# Patient Record
Sex: Male | Born: 1960 | Race: Black or African American | Hispanic: No | Marital: Single | Smoking: Current every day smoker
Health system: Southern US, Community
[De-identification: ages and names within clinical notes are randomized; demographics above are authoritative.]

## PROBLEM LIST (undated history)

## (undated) DIAGNOSIS — K219 Gastro-esophageal reflux disease without esophagitis: Secondary | ICD-10-CM

## (undated) DIAGNOSIS — E785 Hyperlipidemia, unspecified: Secondary | ICD-10-CM

## (undated) HISTORY — DX: Gastro-esophageal reflux disease without esophagitis: K21.9

---

## 2003-02-13 ENCOUNTER — Emergency Department (HOSPITAL_COMMUNITY): Admission: EM | Admit: 2003-02-13 | Discharge: 2003-02-13 | Payer: Self-pay | Admitting: Emergency Medicine

## 2003-03-25 ENCOUNTER — Emergency Department (HOSPITAL_COMMUNITY): Admission: EM | Admit: 2003-03-25 | Discharge: 2003-03-25 | Payer: Self-pay | Admitting: Emergency Medicine

## 2003-12-06 ENCOUNTER — Ambulatory Visit (HOSPITAL_COMMUNITY): Admission: RE | Admit: 2003-12-06 | Discharge: 2003-12-06 | Payer: Self-pay | Admitting: Pulmonary Disease

## 2004-09-30 ENCOUNTER — Emergency Department (HOSPITAL_COMMUNITY): Admission: EM | Admit: 2004-09-30 | Discharge: 2004-09-30 | Payer: Self-pay | Admitting: Family Medicine

## 2005-06-05 ENCOUNTER — Emergency Department (HOSPITAL_COMMUNITY): Admission: AD | Admit: 2005-06-05 | Discharge: 2005-06-05 | Payer: Self-pay | Admitting: Family Medicine

## 2005-08-11 ENCOUNTER — Emergency Department (HOSPITAL_COMMUNITY): Admission: EM | Admit: 2005-08-11 | Discharge: 2005-08-11 | Payer: Self-pay | Admitting: Family Medicine

## 2005-08-12 ENCOUNTER — Ambulatory Visit (HOSPITAL_COMMUNITY): Admission: RE | Admit: 2005-08-12 | Discharge: 2005-08-12 | Payer: Self-pay | Admitting: Family Medicine

## 2005-08-12 ENCOUNTER — Emergency Department (HOSPITAL_COMMUNITY): Admission: EM | Admit: 2005-08-12 | Discharge: 2005-08-12 | Payer: Self-pay | Admitting: Family Medicine

## 2007-02-20 ENCOUNTER — Emergency Department (HOSPITAL_COMMUNITY): Admission: EM | Admit: 2007-02-20 | Discharge: 2007-02-20 | Payer: Self-pay | Admitting: Emergency Medicine

## 2008-04-04 ENCOUNTER — Ambulatory Visit (HOSPITAL_BASED_OUTPATIENT_CLINIC_OR_DEPARTMENT_OTHER): Admission: RE | Admit: 2008-04-04 | Discharge: 2008-04-05 | Payer: Self-pay | Admitting: Specialist

## 2009-08-12 ENCOUNTER — Encounter: Payer: Self-pay | Admitting: Internal Medicine

## 2009-12-16 ENCOUNTER — Encounter: Payer: Self-pay | Admitting: Internal Medicine

## 2009-12-24 ENCOUNTER — Encounter: Payer: Self-pay | Admitting: Internal Medicine

## 2009-12-30 ENCOUNTER — Ambulatory Visit (HOSPITAL_COMMUNITY): Admission: RE | Admit: 2009-12-30 | Discharge: 2009-12-30 | Payer: Self-pay | Admitting: Pulmonary Disease

## 2010-01-09 ENCOUNTER — Ambulatory Visit (HOSPITAL_COMMUNITY): Admission: RE | Admit: 2010-01-09 | Discharge: 2010-01-09 | Payer: Self-pay | Admitting: Pulmonary Disease

## 2010-01-14 ENCOUNTER — Encounter: Payer: Self-pay | Admitting: Internal Medicine

## 2010-01-20 ENCOUNTER — Ambulatory Visit: Payer: Self-pay | Admitting: Internal Medicine

## 2010-01-20 DIAGNOSIS — R0602 Shortness of breath: Secondary | ICD-10-CM | POA: Insufficient documentation

## 2010-01-20 DIAGNOSIS — J189 Pneumonia, unspecified organism: Secondary | ICD-10-CM | POA: Insufficient documentation

## 2010-01-20 DIAGNOSIS — K219 Gastro-esophageal reflux disease without esophagitis: Secondary | ICD-10-CM | POA: Insufficient documentation

## 2010-01-20 DIAGNOSIS — I10 Essential (primary) hypertension: Secondary | ICD-10-CM | POA: Insufficient documentation

## 2010-01-20 DIAGNOSIS — E7800 Pure hypercholesterolemia, unspecified: Secondary | ICD-10-CM | POA: Insufficient documentation

## 2010-01-20 DIAGNOSIS — R5383 Other fatigue: Secondary | ICD-10-CM

## 2010-01-20 DIAGNOSIS — F17200 Nicotine dependence, unspecified, uncomplicated: Secondary | ICD-10-CM | POA: Insufficient documentation

## 2010-01-20 DIAGNOSIS — IMO0001 Reserved for inherently not codable concepts without codable children: Secondary | ICD-10-CM | POA: Insufficient documentation

## 2010-01-20 DIAGNOSIS — R5381 Other malaise: Secondary | ICD-10-CM | POA: Insufficient documentation

## 2010-01-29 ENCOUNTER — Encounter: Payer: Self-pay | Admitting: Internal Medicine

## 2010-01-29 ENCOUNTER — Ambulatory Visit (HOSPITAL_COMMUNITY): Admission: RE | Admit: 2010-01-29 | Discharge: 2010-01-29 | Payer: Self-pay | Admitting: Internal Medicine

## 2010-02-10 ENCOUNTER — Ambulatory Visit: Payer: Self-pay | Admitting: Internal Medicine

## 2010-02-10 DIAGNOSIS — J984 Other disorders of lung: Secondary | ICD-10-CM | POA: Insufficient documentation

## 2010-02-14 ENCOUNTER — Emergency Department (HOSPITAL_COMMUNITY): Admission: EM | Admit: 2010-02-14 | Discharge: 2010-02-14 | Payer: Self-pay | Admitting: Emergency Medicine

## 2010-02-16 ENCOUNTER — Ambulatory Visit: Payer: Self-pay | Admitting: Internal Medicine

## 2010-04-08 ENCOUNTER — Ambulatory Visit (HOSPITAL_COMMUNITY): Admission: RE | Admit: 2010-04-08 | Discharge: 2010-04-08 | Payer: Self-pay | Admitting: Pulmonary Disease

## 2010-04-16 ENCOUNTER — Ambulatory Visit (HOSPITAL_COMMUNITY): Admission: RE | Admit: 2010-04-16 | Discharge: 2010-04-16 | Payer: Self-pay | Admitting: Pulmonary Disease

## 2010-08-02 ENCOUNTER — Encounter: Payer: Self-pay | Admitting: Pulmonary Disease

## 2010-08-11 NOTE — Assessment & Plan Note (Signed)
Summary: rov/pft/cxr/apc   Visit Type:  Follow-up Copy to:  Dr Corine Shelter Primary Provider/Referring Provider:  Dr Corine Shelter  CC:  2 wk follow up with CXR and to discuss PFT results.  Pt states approx 1 wk after last OV SOB resolved.  However states he "caught a cold" x1 week ago-SOB returned.  States he coughs at time-cough is prod with white mucus.  Denies wheezing and chest tightness. Currently smoking 1/2ppd.  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 PReviously well and very functional wihtout symptoms other than occassional cough with cigarrettes. States that on 12/09/2009 had sudden onset Rt infraclavicular chest pain while on job in [place]. After that, felt he was "going down hill"; feeling dizzy, not eating well, no energy and chills. Was trying to working during this time. States that with this he got dehydrated and could not work. States he was in and out of consciousness. Finally admitted in hospital to ICU  in [place], [place] 12/16/2009 - 12/24/2009 for "pneumonia" per history. Returned back to [employer] after discharge. He then followed with Dr. Petra Kuba here in town on 12/30/2009. CXR showed RUL atlectasis/infiltrte c/w pneumonia. Folllowup CXR 01/09/2010 showed persistence of infiltrate and therefore referred here.    Current symptoms are dyspnea and tiredeness - all new since hospitalization. Dyspnea is unimproved since discharge from hospital. Dyspnea brought on by exertion like "Walking good distances". Symptoms relieved by "resting". Denies current chest pain and wheezing but admits to some associated dry cough.  Also has left 4th and 5th finger numbness with toe numbness - all new since discharge.   REcl 1) Repeat cxr on 02/10/2010 - 8 weeks since pna 2) get outside record 3) quit smoking 4) get ful PFTOV 02/10/2010: follows today for tobacco abuse, RUL PNA in June 2011, and dyspnea after PFTs. Since last visit is doing well overall.  Dyspnea improved but a week ago he picked up a cold and felt dyspneic and wheezy again but now almost back to baseline. STill smoking 1/2 packper day. No definite quit plans. He is not ready today for chantix.  CXR done today shows improvement but stil persistence of RUL infilrate compated to June 2011. Review of records ffrom [place] shows that he was indeed admitted for RUL wedge shaped pneumonia and wasin ICU for the same. No films from [place] present but it appears RUL infiltrate  was improving there.   Preventive Screening-Counseling & Management  Alcohol-Tobacco     Smoking Status: current     Smoking Cessation Counseling: yes     Smoke Cessation Stage: contemplative     Packs/Day: 1/2 PPD     Year Started: 1980     Tobacco Counseling: to quit use of tobacco products  Current Medications (verified): 1)  Crestor 20 Mg Tabs (Rosuvastatin Calcium) .... Take 1 Tablet By Mouth Once A Day 2)  Trilipix 135 Mg Cpdr (Choline Fenofibrate) .... Take 1 Tablet By Mouth Once A Day 3)  Prevacid 15 Mg Cpdr (Lansoprazole) .... Take 1 Tablet By Mouth Once A Day  Allergies (verified): No Known Drug Allergies  Past History:  Family History: Last updated: 01/20/2010 PT WAS ADOPTED--HIS ADOPTIVE FATHER HAD STOMACH CANCER  Social History: Last updated: 01/20/2010 CURRENT SMOKER--1/2 PPD STARTED IN 1980 SINGLE NO CHILDREN LIVES WITH HIS SISTER TRUCK DRIVER FORMER DRUG ABUSE--QUIT 04-28-1999  Risk Factors: Smoking Status: current (02/10/2010) Packs/Day: 1/2 PPD (02/10/2010)  Past Medical History: GERD Hyperlipidemia Gold Stage 1 COPD > PFT  01/29/2010 - FVC 5L/96%, Fev1 3.45L/82%, Ratio 69, TLC 9L/113%, RV 3.45l/113, dlco 26/63% Tobacco Abuse  RUL PNA June 2011 in [place] Denies CAD, stroke, edema, Arthritis  Past Surgical History: Reviewed history from 01/20/2010 and no changes required. EAR SURGERY BUNION REMOVAL ROTATOR CUFF SURGERY  Past Pulmonary History:  Pulmonary  History: RESULTS from [facility]  > 12/19/2009 - hiv NEGATIVE  Family History: Reviewed history from 01/20/2010 and no changes required. PT WAS ADOPTED--HIS ADOPTIVE FATHER HAD STOMACH CANCER  Social History: Reviewed history from 01/20/2010 and no changes required. CURRENT SMOKER--1/2 PPD STARTED IN 1980 SINGLE NO CHILDREN LIVES WITH HIS SISTER TRUCK DRIVER FORMER DRUG ABUSE--QUIT 04-28-1999  Review of Systems       The patient complains of shortness of breath with activity, shortness of breath at rest, productive cough, acid heartburn, headaches, nasal congestion/difficulty breathing through nose, and sneezing.  The patient denies non-productive cough, coughing up blood, chest pain, irregular heartbeats, indigestion, loss of appetite, weight change, abdominal pain, difficulty swallowing, sore throat, tooth/dental problems, itching, ear ache, anxiety, depression, hand/feet swelling, joint stiffness or pain, rash, change in color of mucus, and fever.    Vital Signs:  Patient profile:   50 year old male Height:      75 inches Weight:      221 pounds BMI:     27.72 O2 Sat:      94 % Temp:     96.5 degrees F Pulse rate:   82 / minute BP sitting:   132 / 90  (right arm) Cuff size:   regular  Vitals Entered By: Gweneth Dimitri RN (February 10, 2010 3:45 PM) CC: 2 wk follow up with CXR and to discuss PFT results.  Pt states approx 1 wk after last OV SOB resolved.  However states he "caught a cold" x1 week ago-SOB returned.  States he coughs at time-cough is prod with white mucus.  Denies wheezing and chest tightness. Currently smoking 1/2ppd Comments Medications reviewed with patient Daytime contact number verified with patient. Gweneth Dimitri RN  February 10, 2010 3:50 PM    Physical Exam  General:  well developed, well nourished, in no acute distress Head:  normocephalic and atraumatic Eyes:  PERRLA/EOM intact; conjunctiva and sclera clear Ears:  TMs intact and clear  with normal canals Nose:  no deformity, discharge, inflammation, or lesions Mouth:  no deformity or lesions Neck:  no masses, thyromegaly, or abnormal cervical nodes Chest Wall:  no deformities noted Lungs:  clear bilaterally to auscultation and percussion Heart:  regular rate and rhythm, S1, S2 without murmurs, rubs, gallops, or clicks Abdomen:  bowel sounds positive; abdomen soft and non-tender without masses, or organomegaly Msk:  no deformity or scoliosis noted with normal posture Pulses:  pulses normal Extremities:  no clubbing, cyanosis, edema, or deformity noted Neurologic:  CN II-XII grossly intact with normal reflexes, coordination, muscle strength and tone Skin:  intact without lesions or rashes Cervical Nodes:  no significant adenopathy Axillary Nodes:  no significant adenopathy Psych:  alert and cooperative; normal mood and affect; normal attention span and concentration   CXR  Procedure date:  02/10/2010  Findings:      improving but persistent infiltrate RUL  Comments:      independently rviewed  Impression & Recommendations:  Problem # 1:  PNEUMONIA, ORGAN UNSPECIFIED (ICD-486) Assessment Improved  Orders: Radiology Referral (Radiology) T-2 View CXR (71020TC) Est. Patient Level IV (67124) He had severe RUL CAP and sepsis with  alterened mental status in early June 2011.  There is  RUL infiltrate as of 01/09/2010. I will see him in 2 weeks with ful cxr which shuld put him at 2 months since onset of symptoms. CXR 02/10/2010 shows persistent RUL infitlrate even though he is 2 months out of Rx. Due to smoking hx best to get CT scan Orders: Pulmonary Referral (Pulmonary)  Problem # 2:  TOBACCO ABUSE (ICD-305.1) Assessment: Unchanged still smoking half pack per day. Not ready to quit.No quit date set. HEsitatnt today to start chantix buecause of unreadiness to quit. I have told him to motivate himself to quti and next visit I want a quit date  3 min  counselling Orders: Radiology Referral (Radiology) Est. Patient Level IV (16109) Tobacco use cessation intermediate 3-10 minutes (60454)  Problem # 3:  SHORTNESS OF BREATH (SOB) (ICD-786.05) Assessment: Improved PFT shows gold stage 1 copd. Will start spiriva and see how he does. Samples given Orders: T-2 View CXR (71020TC) Est. Patient Level IV (09811)  Medications Added to Medication List This Visit: 1)  Prevacid 15 Mg Cpdr (Lansoprazole) .... Take 1 tablet by mouth once a day  Patient Instructions: 1)  please have cT scan chest 2)  my nurse will give you 2 samples of spiriva 3)  take 1 puff daily - learn technique from my nurse 4)  I will call you with CT scan results to decide next step

## 2010-08-11 NOTE — Letter (Signed)
Summary: [facility]  [facility]   Imported By: Lester Flemington 02/18/2010 10:29:50  _____________________________________________________________________  External Attachment:    Type:   Image     Comment:   External Document

## 2010-08-11 NOTE — Letter (Signed)
Summary: Corine Shelter MD  Corine Shelter MD   Imported By: Sherian Rein 01/22/2010 10:47:00  _____________________________________________________________________  External Attachment:    Type:   Image     Comment:   External Document

## 2010-08-11 NOTE — Assessment & Plan Note (Signed)
Summary: pna, rul pulm infiltrates/apc   Visit Type:  Initial Consult Copy to:  Dr Corine Shelter Primary Provider/Referring Provider:  Dr Corine Shelter  CC:  CONSULT FOR PNA--SHORTNESS OF BREATH.  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 PReviously well and very functional wihtout symptoms other than occassional cough with cigarrettes. States that on 12/09/2009 had sudden onset Rt infraclavicular chest pain while on job in [place]. After that, felt he was "going down hill"; feeling dizzy, not eating well, no energy and chills. Was trying to working during this time. States that with this he got dehydrated and could not work. States he was in and out of consciousness. Finally admitted in hospital to ICU  in [place], [place] 12/16/2009 - 12/24/2009 for "pneumonia" per history. Returned back to [employer] after discharge. He then followed with Dr. Petra Kuba here in town on 12/30/2009. CXR showed RUL atlectasis/infiltrte c/w pneumonia. Folllowup CXR 01/09/2010 showed persistence of infiltrate and therefore referred here.    Current symptoms are dyspnea and tiredeness - all new since hospitalization. Dyspnea is unimproved since discharge from hospital. Dyspnea brought on by exertion like "Walking good distances". Symptoms relieved by "resting". Denies current chest pain and wheezing but admits to some associated dry cough.  Also has left 4th and 5th finger numbness with toe numbness - all new since discharge.   Preventive Screening-Counseling & Management  Alcohol-Tobacco     Smoking Status: current     Smoking Cessation Counseling: yes     Smoke Cessation Stage: contemplative     Packs/Day: 1/2 PPD     Year Started: 1980  Comments: has tried chantix in year 2010 for 1 week. No side effects. He felt it was effective but was expensive. He admitted that cost of smoking and chantix is the same      Drug Use:  former and QUIT 2000.    Allergies  (verified): No Known Drug Allergies  Past History:  Family History: Last updated: 01/20/2010 PT WAS ADOPTED--HIS ADOPTIVE FATHER HAD STOMACH CANCER  Social History: Last updated: 01/20/2010 CURRENT SMOKER--1/2 PPD STARTED IN 1980 SINGLE NO CHILDREN LIVES WITH HIS SISTER TRUCK DRIVER FORMER DRUG ABUSE--QUIT 04-28-1999  Risk Factors: Smoking Status: current (01/20/2010) Packs/Day: 1/2 PPD (01/20/2010)  Past Medical History: GERD Hyperlipidemia Denies CAD, stroke, edema, Arthritis  Past Surgical History: EAR SURGERY BUNION REMOVAL ROTATOR CUFF SURGERY  Family History: PT WAS ADOPTED--HIS ADOPTIVE FATHER HAD STOMACH CANCER  Social History: CURRENT SMOKER--1/2 PPD STARTED IN 1980 SINGLE NO CHILDREN LIVES WITH HIS SISTER TRUCK DRIVER FORMER DRUG ABUSE--QUIT 10-17-2000Smoking Status:  current Packs/Day:  1/2 PPD Drug Use:  former, QUIT 2000  Review of Systems       The patient complains of shortness of breath with activity, acid heartburn, and hand/feet swelling.  The patient denies shortness of breath at rest, productive cough, non-productive cough, coughing up blood, chest pain, irregular heartbeats, indigestion, loss of appetite, weight change, abdominal pain, difficulty swallowing, sore throat, tooth/dental problems, headaches, nasal congestion/difficulty breathing through nose, sneezing, itching, ear ache, anxiety, depression, joint stiffness or pain, rash, change in color of mucus, and fever.         HAND AND FEET NUMBNESS - since discharge  Vital Signs:  Patient profile:   50 year old male Height:      75 inches Weight:      217.50 pounds BMI:     27.28 O2 Sat:      96 % on Room air Temp:  98.4 degrees F oral Pulse rate:   97 / minute BP sitting:   134 / 78  (right arm) Cuff size:   regular  Vitals Entered By: Randell Loop CMA (January 20, 2010 10:27 AM)  O2 Sat at Rest %:  96 O2 Flow:  Room air CC: CONSULT FOR PNA--SHORTNESS OF  BREATH   Physical Exam  General:  well developed, well nourished, in no acute distress Head:  normocephalic and atraumatic Eyes:  PERRLA/EOM intact; conjunctiva and sclera clear Ears:  TMs intact and clear with normal canals Nose:  no deformity, discharge, inflammation, or lesions Mouth:  no deformity or lesions Neck:  no masses, thyromegaly, or abnormal cervical nodes Chest Wall:  no deformities noted Lungs:  clear bilaterally to auscultation and percussion Heart:  regular rate and rhythm, S1, S2 without murmurs, rubs, gallops, or clicks Abdomen:  bowel sounds positive; abdomen soft and non-tender without masses, or organomegaly Msk:  no deformity or scoliosis noted with normal posture Pulses:  pulses normal Extremities:  no clubbing, cyanosis, edema, or deformity noted Neurologic:  CN II-XII grossly intact with normal reflexes, coordination, muscle strength and tone Skin:  intact without lesions or rashes Cervical Nodes:  no significant adenopathy Axillary Nodes:  no significant adenopathy Psych:  alert and cooperative; normal mood and affect; normal attention span and concentration   CXR  Procedure date:  12/30/2009  Findings:      Comparison: 04/04/2008.  No recent chest radiograph or chest CT is   available for comparison.    Findings: Heart size within normal limits; heart and mediastinal   contours are stable.  Trachea is midline.    Patchy faint airspace opacity in the right upper lobe adjacent to   the minor fissure is present. This is compatible with pneumonia.   No dense consolidation or cavitation identified.  This represents a   change compared to chest radiograph of 2009.  3-4 mm calcific   density left midlung zone is unchanged, and most consistent with a   stable calcified granuloma.    Question left nipple shadow (left basilar pulmonary nodule is not   excluded).    Negative for pulmonary edema.    Mild convex right scoliosis of the upper thoracic  spine appears   stable.  No acute bony abnormality is seen.    IMPRESSION:    1.  Right upper lobe airspace disease is compatible with clinical   diagnosis of pneumonia.  There are no recent prior chest   radiographs for comparison.  Follow-up chest radiograph to clearing   is recommended.  Suggest nipple markers on the follow-up chest   radiograph, as there is a probable left nipple shadow seen on   today's exam.   2.  Tiny stable calcified granuloma left lung.    Read By:  Oliver Hum,  M.D.   Released By:  Oliver Hum,  M.D.   Comments:      reviewed  CXR  Procedure date:  01/09/2010  Findings:       Clinical Data: Pneumonia follow up.  Coughing and shortness of   breath.  History of tobacco smoking.    CHEST - 2 VIEW    Comparison: 04/04/2008.  12/30/2009.    Findings: Abnormal opacity persists in the basilar portion of the   right upper lobe just superior to the minor fissure on the PA view   and overlying the descending aorta proximally on the lateral image.   No clearing has occurred  since prior study.    There is overall hyperinflation configuration.  Small calcified   granuloma is unchanged in the lower left midlung zone.  The vague   area of nodularity in the left base is again seen and may be   related to nipple.  Pulmonary nodule is not excluded.    The cardiac silhouette is normal size and shape. There is slight   scoliosis convexity to the right    IMPRESSION:   Persistent abnormal opacity is seen in the basilar portion of the   right upper lobe.  No clearing has occurred with therapy.     Is the patient still symptomatic.  Further therapy could be given   with follow-up radiograph examination after further therapy.   Suggest consideration of CT of the chest for additional evaluation   of nonclearing abnormal pulmonary opacity.  Vague area of   nodularity in the left base may be related to nipple.  Pulmonary   nodule not excluded.    Read  By:  Crawford Givens,  M.D.   Released By:  Crawford Givens,  M.D.   Comments:      reviewed  MISC. Report  Procedure date:  08/12/2009  Findings:      wc 12K Hb 15.5gm% Plat 252K Creat 1.1  Impression & Recommendations:  Problem # 1:  PNEUMONIA, ORGAN UNSPECIFIED (ICD-486) Assessment New  It sound like he had severe CAP and sepsis with alterened mental status in early June 2011. This all per his hx. REcords unavailable becuase he was treated in [place]. There is  RUL infiltrate as of 01/09/2010. I will see him in 2 weeks with ful cxr which shuld put him at 2 months since onset of symptoms. If infiltrate unresolved, will do CT scan  Orders: Pulmonary Referral (Pulmonary) Consultation Level V (15176) Tobacco use cessation intensive >10 minutes (16073)  Problem # 2:  FATIGUE (ICD-780.79) Assessment: New He has significant fatigue post hospitalization. THis could just be sequelae from sepsis. However, he is on statins. Apparently he has had blood work last week. I will get these from Dr. Petra Kuba. If needed will check CK  Problem # 3:  TOBACCO ABUSE (ICD-305.1) Assessment: New WE discussed for 11 minutes smoking habits and quitting. He is in contemplative phase. HE did tolerate chantix for a week in 2010. He found it expensive but the same as smoking. I pointed this out. He is wiling to try chantix next week. He does not have guns, and is not suicidal or homcidoal or has psych history. Does have CAD risk factors but age is on his side. We will start chantix next visit Orders: Pulmonary Referral (Pulmonary) Consultation Level V (71062) Tobacco use cessation intensive >10 minutes (69485)  Problem # 4:  SHORTNESS OF BREATH (SOB) (ICD-786.05) Assessment: New This is new since hospitalization for pneumonia in Early June 2011. COuld reflect deconditioning of COPD coming to light. Will get PFts in 2 weeks and decide mgmt Orders: Pulmonary Referral (Pulmonary) TLB-CBC Platelet -  w/Differential (85025-CBCD) Consultation Level V (46270)  Medications Added to Medication List This Visit: 1)  Crestor 20 Mg Tabs (Rosuvastatin calcium) .... Take 1 tablet by mouth once a day 2)  Trilipix 135 Mg Cpdr (Choline fenofibrate) .... Take 1 tablet by mouth once a day  Other Orders: TLB-Hepatic/Liver Function Pnl (80076-HEPATIC) TLB-CK-MB (Creatine Kinase MB) (82553-CKMB) TLB-CK Total Only(Creatine Kinase/CPK) (82550-CK)  Patient Instructions: 1)  please sign release to get all your records from [place] 2)  Get blood  test results from Dr. Petra Kuba house 3)  Please get CXR and full breathing test in 2 weeks 4)  return to see me after above 5)  Please work on quitting smoking by yourself 6)  Next visit we can start chantix 7)  Discuss numbness with your doctor    Appended Document: walk done at ov today    Clinical Lists Changes      walk done with pt at ov today--  initial check---98% ra  HR  95  1 st lap--100% ra  HR 101 2nd lap--100%ra  HR 106 3rd lap--100%ra  HR 106  rest--100%ra  Hr 101  test finished but pt c/o of a little dizziness. Randell Loop [facility]  January 20, 2010 11:52 AM

## 2010-10-13 ENCOUNTER — Other Ambulatory Visit (HOSPITAL_COMMUNITY): Payer: Self-pay | Admitting: Pulmonary Disease

## 2010-10-13 ENCOUNTER — Ambulatory Visit (HOSPITAL_COMMUNITY)
Admission: RE | Admit: 2010-10-13 | Discharge: 2010-10-13 | Disposition: A | Discharge status: Home / Self Care | Payer: 59 | Source: Ambulatory Visit | Attending: Pulmonary Disease | Admitting: Pulmonary Disease

## 2010-10-13 DIAGNOSIS — R079 Chest pain, unspecified: Secondary | ICD-10-CM | POA: Insufficient documentation

## 2010-10-13 DIAGNOSIS — R52 Pain, unspecified: Secondary | ICD-10-CM

## 2010-11-24 NOTE — Op Note (Signed)
NAMEMarland King  Melvin, King NO.:  [id number]   MEDICAL RECORD NO.:  [id number]          PATIENT TYPE:  AMB   LOCATION:  [facility]                         FACILITY:  [facility]   PHYSICIAN:  Jene Every, M.D.    DATE OF BIRTH:  1960-07-26   DATE OF PROCEDURE:  04/04/2008  DATE OF DISCHARGE:                               OPERATIVE REPORT   PREOPERATIVE DIAGNOSIS:  Rotator cuff tear and impingement syndrome,  right shoulder.   POSTOPERATIVE DIAGNOSIS:  Rotator cuff tear and impingement syndrome,  right shoulder, labral tear.   PROCEDURE PERFORMED:  1. Right shoulder arthroscopy.  2. Debridement of anterior glenoid labral tear.  3. Subacromial decompression, bursectomy, acromioplasty.  4. Mini open rotator cuff repair.   ANESTHESIA:  General.   ASSISTANT:  Roma Schanz, P.A.   BRIEF HISTORY AND INDICATIONS:  The patient is a 47-year male with  refractory pain in the shoulder.  He had a rotator cuff tear that  appeared to be a partial tear by his MRI.  He had not improved with  conservative treatment and is indicated for evaluation, subacromial  decompression, debridement, possible open rotator cuff repair.  Risks  and benefits discussed including bleeding, infection, no change in  symptoms, worsening symptoms, need for repeat debridement, anesthetic  complications etc.   TECHNIQUE:  Placed in supine beach-chair position after inducted with  adequate general anesthesia and 2 grams Kefzol the right shoulder upper  extremity prepped and draped in the usual sterile fashion.  A surgical  marker was utilized to delineate the acromion, AC joint and the  coracoid.  Good range of motion was noted. A standard posterolateral  portal was utilized.  Incision through the skin only.  Arthroscopic  cannula was directed towards the glenohumeral joint penetrating the  capsule atraumatically with the arm in 70/30 position with 10-15 pounds  traction applied.  Noted an anterior tear of  the glenoid labrum.  It was  not detached.  There was an undersurface tear of the rotator cuff noted  as well, position consistent with that seen on MRI.   Next, an anterior portal was utilized.  Incision made through the skin  only between the coracoid and the anterolateral aspect of the acromion  and directed the cannula into the glenohumeral joint just beneath the  biceps under direct visualization, penetrating the capsule  atraumatically.  Collene Mares was introduced, utilized to debride the anterior  labrum.  Subscap was unremarkable.  Minor degenerative changes of  glenohumeral joint.  Under surface tear was noted significantly of the  rotator cuff though very small.  I placed an 18 gauge spinal needle from  external to internal through this area of pathology and threaded a  Ethibond suture through that, removed the knee.  This marked the area  for the repair.   Next we redirected the camera in the subacromial space.  A lateral  portal was utilized.  Again incision through the skin only.  Hypertrophic bursa was noted and bursitis inflammatory arthropathy.  We  performed a bursectomy and we morselized the CA ligament.  Anterolateral  spur was removed.  The threaded portion of the Ethibond suture was  identified in the supraspinatus the more posterior portion of that.  After the subacromial decompression, I made a small incision  anterolaterally through the skin only. A small incision through the  raphe, a self-retaining retractor.  We identified the insertion of the  PDS suture.  A very small tear was noted.  I incised it about 0.5 cm in  length debrided it to good bleeding tissue and repaired it side-to-side  with 0 Vicryl in simple sutures.  Again side-to-side there was no  significant tear noted here.  We did not need a suture anchor.  Again we  inspected beneath the subacromial space.  It was improved following the  release the CA ligament.  I  remove a small spur over the  anterolateral  aspect of the acromion with a Theatre manager.  I digitally palpated the  subacromial space and found it to be satisfactory.  Wound was copiously  irrigated.  I then fully inspected the rotator cuff with no evidence of  further tearing.  I repaired the raphe with #1 Vicryl interrupted figure-  of-eight sutures.  Subcutaneous tissue reapproximated with 2-0 Vicryl  simple sutures.  Skin was reapproximated with 4-0 subcuticular Prolene.  Wound reinforced with Steri-Strips.  Sterile dressing applied.  Placed  supine on the hospital bed, extubated without difficulty and transported  to recovery room in satisfactory condition.   The patient tolerated the procedure well with no complications.  He was  placed in a sling.      Jene Every, M.D.  Electronically Signed     JB/MEDQ  D:  04/04/2008  T:  04/06/2008  Job:  [id number]

## 2011-05-23 ENCOUNTER — Emergency Department (INDEPENDENT_AMBULATORY_CARE_PROVIDER_SITE_OTHER): Payer: 59

## 2011-05-23 ENCOUNTER — Encounter: Payer: Self-pay | Admitting: *Deleted

## 2011-05-23 ENCOUNTER — Emergency Department (INDEPENDENT_AMBULATORY_CARE_PROVIDER_SITE_OTHER)
Admission: EM | Admit: 2011-05-23 | Discharge: 2011-05-23 | Disposition: A | Discharge status: Home / Self Care | Payer: 59 | Source: Home / Self Care | Attending: Emergency Medicine | Admitting: Emergency Medicine

## 2011-05-23 DIAGNOSIS — S62309A Unspecified fracture of unspecified metacarpal bone, initial encounter for closed fracture: Secondary | ICD-10-CM

## 2011-05-23 HISTORY — DX: Hyperlipidemia, unspecified: E78.5

## 2011-05-23 MED ORDER — HYDROCODONE-IBUPROFEN 7.5-200 MG PO TABS: 1.0000 | ORAL_TABLET | Freq: Three times a day (TID) | ORAL | Status: AC | PRN
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
## 2011-05-23 NOTE — ED Notes (Signed)
Awaiting consult w/ Dr. Merlyn Lot.  Pt notified.

## 2011-05-23 NOTE — Progress Notes (Signed)
Orthopedic Tech Progress Note Patient Details:  Melvin King [birth year] [id number]  Type of Splint: Other (comment) (ulnar gutter) Splint Location: right hand Splint Interventions: Application    Nikki Dom 05/23/2011, 4:20 PM

## 2011-05-23 NOTE — ED Notes (Signed)
Ortho tech present applying splint. 
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
## 2012-08-15 ENCOUNTER — Encounter (HOSPITAL_COMMUNITY): Payer: Self-pay | Admitting: *Deleted

## 2012-08-15 ENCOUNTER — Emergency Department (HOSPITAL_COMMUNITY)
Admission: EM | Admit: 2012-08-15 | Discharge: 2012-08-15 | Disposition: A | Discharge status: Home / Self Care | Payer: 59 | Attending: Emergency Medicine | Admitting: Emergency Medicine

## 2012-08-15 DIAGNOSIS — R509 Fever, unspecified: Secondary | ICD-10-CM | POA: Insufficient documentation

## 2012-08-15 DIAGNOSIS — B9789 Other viral agents as the cause of diseases classified elsewhere: Secondary | ICD-10-CM | POA: Insufficient documentation

## 2012-08-15 DIAGNOSIS — E785 Hyperlipidemia, unspecified: Secondary | ICD-10-CM | POA: Insufficient documentation

## 2012-08-15 DIAGNOSIS — F17200 Nicotine dependence, unspecified, uncomplicated: Secondary | ICD-10-CM | POA: Insufficient documentation

## 2012-08-15 DIAGNOSIS — R5381 Other malaise: Secondary | ICD-10-CM | POA: Insufficient documentation

## 2012-08-15 DIAGNOSIS — IMO0001 Reserved for inherently not codable concepts without codable children: Secondary | ICD-10-CM | POA: Insufficient documentation

## 2012-08-15 DIAGNOSIS — R51 Headache: Secondary | ICD-10-CM | POA: Insufficient documentation

## 2012-08-15 DIAGNOSIS — B349 Viral infection, unspecified: Secondary | ICD-10-CM

## 2012-08-15 MED ORDER — GUAIFENESIN-CODEINE 100-10 MG/5ML PO SYRP: 5.0000 mL | ORAL_SOLUTION | Freq: Three times a day (TID) | ORAL | Status: DC | PRN

## 2012-08-15 NOTE — ED Provider Notes (Signed)
History   This chart was scribed for Melvin King, a non-physician practitioner working with Gilda Crease, * by Lewanda Rife, ED Scribe. This patient was seen in room WTR5/WTR5 and the patient's care was started at 3:23 pm.     CSN: [id number]  Arrival date & time 08/15/12  1431   None     Chief Complaint  Patient presents with  . Cough  . Fever    (Consider location/radiation/quality/duration/timing/severity/associated sxs/prior treatment) HPI Melvin King is a 52 y.o. male who presents to the Emergency Department complaining of intermittent productive cough with green sputum onset 3 days. Pt reports constant moderate generalized myalgias, low-grade fever, fatigue, and headaches. Pt denies otalgia, emesis, hemoptysis, diarrhea, and sore throat. Pt denies taking any medications at home to treat symptoms. Pt reports symptoms are aggravated by nothing and relieved by nothing. Pt denies getting flu shot this year.   Past Medical History  Diagnosis Date  . Hyperlipidemia     History reviewed. No pertinent past surgical history.  History reviewed. No pertinent family history.  History  Substance Use Topics  . Smoking status: Current Every Day Smoker -- 1.0 packs/day    Types: Cigarettes  . Smokeless tobacco: Not on file  . Alcohol Use: No      Review of Systems  Constitutional: Positive for fever.  HENT: Negative for sore throat and rhinorrhea.   Respiratory: Positive for cough.   Cardiovascular: Negative.   Gastrointestinal: Negative.  Negative for nausea, vomiting and diarrhea.  Musculoskeletal: Positive for myalgias.  Skin: Negative.   Neurological: Positive for headaches.  Hematological: Negative.   Psychiatric/Behavioral: Negative.   All other systems reviewed and are negative.   A complete 10 system review of systems was obtained and all systems are negative except as noted in the HPI and PMH.    Allergies  Review of patient's  allergies indicates no known allergies.  Home Medications   Current Outpatient Rx  Name  Route  Sig  Dispense  Refill  . DM-PHENYLEPHRINE-ACETAMINOPHEN 10-5-325 MG/15ML PO LIQD   Oral   Take 15 mLs by mouth every 4 (four) hours as needed. Cold/flu symptoms.         . GUAIFENESIN-CODEINE 100-10 MG/5ML PO SYRP   Oral   Take 5 mLs by mouth 3 (three) times daily as needed for cough.   120 mL   0     BP 114/66  Pulse 111  Temp 99.8 F (37.7 C) (Oral)  Resp 20  SpO2 95%  Physical Exam  Nursing note and vitals reviewed. Constitutional: He is oriented to person, place, and time. He appears well-developed and well-nourished. No distress.       States he feels "tired"  HENT:  Head: Normocephalic and atraumatic.  Right Ear: Tympanic membrane and external ear normal.  Left Ear: Tympanic membrane and external ear normal.  Nose: Nose normal.  Mouth/Throat: Uvula is midline. Mucous membranes are dry (mildly ). No oropharyngeal exudate.  Eyes: Conjunctivae normal and EOM are normal.  Neck: Neck supple. No tracheal deviation present.  Cardiovascular: Normal rate and normal heart sounds.   Pulmonary/Chest: Effort normal and breath sounds normal. No respiratory distress. He has no wheezes. He has no rales.  Abdominal: Soft.  Musculoskeletal: Normal range of motion. He exhibits no edema.  Neurological: He is alert and oriented to person, place, and time.  Skin: Skin is warm and dry.  Psychiatric: He has a normal mood and affect. His behavior is normal.  ED Course  Procedures (including critical care time)  Medications  DM-Phenylephrine-Acetaminophen (TYLENOL COLD MULTI-SYMPTOM) 10-5-325 MG/15ML LIQD (not administered)  guaiFENesin-codeine (ROBITUSSIN AC) 100-10 MG/5ML syrup (not administered)    Labs Reviewed - No data to display No results found.   1. Viral syndrome       MDM  Exam benign. Pt encouraged to drink plenty of fluids and given strict return to ED  precautions such as high fevers, confusion, neck pain or stiffness, headache that wont resolved with Tylenol/IIbuprofen.  Pt has been advised of the symptoms that warrant their return to the ED. Patient has voiced understanding and has agreed to follow-up with the PCP or specialist.  I personally performed the services described in this documentation, which was scribed in my presence. The recorded information has been reviewed and is accurate.      Dorthula Matas, PA 08/15/12 1542

## 2012-08-15 NOTE — ED Notes (Signed)
Pt presents to ed with c/o flu like symptoms x 3 days. Pt reports headache, fever, body aches.

## 2012-08-15 NOTE — ED Provider Notes (Signed)
Medical screening examination/treatment/procedure(s) were performed by non-physician practitioner and as supervising physician I was immediately available for consultation/collaboration.   Gilda Crease, MD 08/15/12 [phone]

## 2013-01-11 ENCOUNTER — Other Ambulatory Visit (HOSPITAL_COMMUNITY): Payer: Self-pay | Admitting: Pulmonary Disease

## 2013-01-11 DIAGNOSIS — R3129 Other microscopic hematuria: Secondary | ICD-10-CM

## 2013-01-16 ENCOUNTER — Ambulatory Visit (HOSPITAL_COMMUNITY)
Admission: RE | Admit: 2013-01-16 | Discharge: 2013-01-16 | Disposition: A | Discharge status: Home / Self Care | Payer: 59 | Source: Ambulatory Visit | Attending: Pulmonary Disease | Admitting: Pulmonary Disease

## 2013-01-16 ENCOUNTER — Ambulatory Visit (HOSPITAL_COMMUNITY): Admission: RE | Admit: 2013-01-16 | Payer: 59 | Source: Ambulatory Visit

## 2013-01-16 DIAGNOSIS — R319 Hematuria, unspecified: Secondary | ICD-10-CM | POA: Insufficient documentation

## 2013-01-16 DIAGNOSIS — R3129 Other microscopic hematuria: Secondary | ICD-10-CM

## 2013-01-16 DIAGNOSIS — R030 Elevated blood-pressure reading, without diagnosis of hypertension: Secondary | ICD-10-CM | POA: Insufficient documentation

## 2015-12-09 DIAGNOSIS — E7800 Pure hypercholesterolemia, unspecified: Secondary | ICD-10-CM | POA: Diagnosis not present

## 2015-12-09 DIAGNOSIS — Z1211 Encounter for screening for malignant neoplasm of colon: Secondary | ICD-10-CM | POA: Diagnosis not present

## 2015-12-09 DIAGNOSIS — K210 Gastro-esophageal reflux disease with esophagitis: Secondary | ICD-10-CM | POA: Diagnosis not present

## 2015-12-09 DIAGNOSIS — J449 Chronic obstructive pulmonary disease, unspecified: Secondary | ICD-10-CM | POA: Diagnosis not present

## 2015-12-09 DIAGNOSIS — Z79891 Long term (current) use of opiate analgesic: Secondary | ICD-10-CM | POA: Diagnosis not present

## 2016-03-22 DIAGNOSIS — Z79891 Long term (current) use of opiate analgesic: Secondary | ICD-10-CM | POA: Diagnosis not present

## 2016-03-22 DIAGNOSIS — J449 Chronic obstructive pulmonary disease, unspecified: Secondary | ICD-10-CM | POA: Diagnosis not present

## 2016-03-22 DIAGNOSIS — K210 Gastro-esophageal reflux disease with esophagitis: Secondary | ICD-10-CM | POA: Diagnosis not present

## 2016-03-22 DIAGNOSIS — R1013 Epigastric pain: Secondary | ICD-10-CM | POA: Diagnosis not present

## 2016-08-02 DIAGNOSIS — E7800 Pure hypercholesterolemia, unspecified: Secondary | ICD-10-CM | POA: Diagnosis not present

## 2016-08-02 DIAGNOSIS — J449 Chronic obstructive pulmonary disease, unspecified: Secondary | ICD-10-CM | POA: Diagnosis not present

## 2016-08-02 DIAGNOSIS — Z125 Encounter for screening for malignant neoplasm of prostate: Secondary | ICD-10-CM | POA: Diagnosis not present

## 2016-08-02 DIAGNOSIS — Z1211 Encounter for screening for malignant neoplasm of colon: Secondary | ICD-10-CM | POA: Diagnosis not present

## 2016-08-02 DIAGNOSIS — Z79891 Long term (current) use of opiate analgesic: Secondary | ICD-10-CM | POA: Diagnosis not present

## 2016-08-02 DIAGNOSIS — Z79899 Other long term (current) drug therapy: Secondary | ICD-10-CM | POA: Diagnosis not present

## 2016-08-02 DIAGNOSIS — R7302 Impaired glucose tolerance (oral): Secondary | ICD-10-CM | POA: Diagnosis not present

## 2016-08-17 DIAGNOSIS — J1100 Influenza due to unidentified influenza virus with unspecified type of pneumonia: Secondary | ICD-10-CM | POA: Diagnosis not present

## 2016-08-17 DIAGNOSIS — R030 Elevated blood-pressure reading, without diagnosis of hypertension: Secondary | ICD-10-CM | POA: Diagnosis not present

## 2016-11-29 DIAGNOSIS — Z79899 Other long term (current) drug therapy: Secondary | ICD-10-CM | POA: Diagnosis not present

## 2016-11-29 DIAGNOSIS — K210 Gastro-esophageal reflux disease with esophagitis: Secondary | ICD-10-CM | POA: Diagnosis not present

## 2016-11-29 DIAGNOSIS — R1013 Epigastric pain: Secondary | ICD-10-CM | POA: Diagnosis not present

## 2016-11-29 DIAGNOSIS — J449 Chronic obstructive pulmonary disease, unspecified: Secondary | ICD-10-CM | POA: Diagnosis not present

## 2016-12-13 DIAGNOSIS — Z01818 Encounter for other preprocedural examination: Secondary | ICD-10-CM | POA: Diagnosis not present

## 2016-12-13 DIAGNOSIS — Z1211 Encounter for screening for malignant neoplasm of colon: Secondary | ICD-10-CM | POA: Diagnosis not present

## 2017-02-11 DIAGNOSIS — D126 Benign neoplasm of colon, unspecified: Secondary | ICD-10-CM | POA: Diagnosis not present

## 2017-02-11 DIAGNOSIS — Z1211 Encounter for screening for malignant neoplasm of colon: Secondary | ICD-10-CM | POA: Diagnosis not present

## 2017-02-11 DIAGNOSIS — R12 Heartburn: Secondary | ICD-10-CM | POA: Diagnosis not present

## 2017-02-11 DIAGNOSIS — K2930 Chronic superficial gastritis without bleeding: Secondary | ICD-10-CM | POA: Diagnosis not present

## 2017-02-11 DIAGNOSIS — K635 Polyp of colon: Secondary | ICD-10-CM | POA: Diagnosis not present

## 2017-02-11 DIAGNOSIS — K219 Gastro-esophageal reflux disease without esophagitis: Secondary | ICD-10-CM | POA: Diagnosis not present

## 2017-02-11 DIAGNOSIS — K640 First degree hemorrhoids: Secondary | ICD-10-CM | POA: Diagnosis not present

## 2017-02-18 DIAGNOSIS — K2930 Chronic superficial gastritis without bleeding: Secondary | ICD-10-CM | POA: Diagnosis not present

## 2017-02-18 DIAGNOSIS — K635 Polyp of colon: Secondary | ICD-10-CM | POA: Diagnosis not present

## 2017-02-18 DIAGNOSIS — D126 Benign neoplasm of colon, unspecified: Secondary | ICD-10-CM | POA: Diagnosis not present

## 2017-02-18 DIAGNOSIS — Z1211 Encounter for screening for malignant neoplasm of colon: Secondary | ICD-10-CM | POA: Diagnosis not present

## 2017-04-04 ENCOUNTER — Encounter (HOSPITAL_COMMUNITY): Payer: Self-pay | Admitting: Emergency Medicine

## 2017-04-04 ENCOUNTER — Ambulatory Visit (HOSPITAL_COMMUNITY)
Admission: EM | Admit: 2017-04-04 | Discharge: 2017-04-04 | Disposition: A | Discharge status: Home / Self Care | Payer: BLUE CROSS/BLUE SHIELD | Attending: Family Medicine | Admitting: Family Medicine

## 2017-04-04 DIAGNOSIS — M545 Low back pain, unspecified: Secondary | ICD-10-CM

## 2017-04-04 MED ORDER — DICLOFENAC SODIUM 75 MG PO TBEC: 75.0000 mg | DELAYED_RELEASE_TABLET | Freq: Two times a day (BID) | ORAL | 0 refills | Status: DC

## 2017-04-04 MED ORDER — CYCLOBENZAPRINE HCL 10 MG PO TABS: 10.0000 mg | ORAL_TABLET | Freq: Three times a day (TID) | ORAL | 0 refills | Status: DC

## 2017-04-04 NOTE — ED Triage Notes (Signed)
PT reports right side lower back pain that he woke up with Friday morning.

## 2017-04-04 NOTE — ED Provider Notes (Signed)
  [facility]   [id number] 04/04/17 Arrival Time: 1046  ASSESSMENT & PLAN:  1. Acute right-sided low back pain without sciatica     Meds ordered this encounter  Medications  . diclofenac (VOLTAREN) 75 MG EC tablet    Sig: Take 1 tablet (75 mg total) by mouth 2 (two) times daily.    Dispense:  14 tablet    Refill:  0  . cyclobenzaprine (FLEXERIL) 10 MG tablet    Sig: Take 1 tablet (10 mg total) by mouth 3 (three) times daily.    Dispense:  20 tablet    Refill:  0   Medication sedation precautions. Work note given. Ensure ROM. Will f/u with PCP if not showing improvement within one week, sooner if needed. Reviewed expectations re: course of current medical issues. Questions answered. Outlined signs and symptoms indicating need for more acute intervention. Patient verbalized understanding. After Visit Summary given.   SUBJECTIVE:  Melvin King is a 56 y.o. male who presents with complaint of left sided lower back discomfort for the past several days. No injury or trauma. Similar symptoms approx one year ago that responded to muscle relaxer medication. Ambulatory. Certain movement make discomfort worse. No specific alleviating factors reported. Normal bowel/bladder habits. OTC ibuprofen without much relief. No extremity sensation changes or weakness.  ROS: As per HPI.   OBJECTIVE:  Vitals:   04/04/17 1215 04/04/17 1216  BP:  (!) 143/85  Pulse:  65  Resp:  16  Temp:  98.4 F (36.9 C)  TempSrc:  Oral  SpO2:  100%  Weight: 220 lb (99.8 kg)   Height:  (1.905 m)     General appearance: alert; no distress Back: tender over low back paraspinal musculature around SI joint; FROM at hips Extremities: no cyanosis or edema; symmetrical with no gross deformities Skin: warm and dry Neurologic: normal gait but moves slowly; normal symmetric reflexes of lower extremities Psychological: alert and cooperative; normal mood and affect  No Known Allergies  Past  Medical History:  Diagnosis Date  . Hyperlipidemia    Social History   Social History  . Marital status: Single    Spouse name: N/A  . Number of children: N/A  . Years of education: N/A   Occupational History  . Not on file.   Social History Main Topics  . Smoking status: Current Every Day Smoker    Packs/day: 1.00    Types: Cigarettes  . Smokeless tobacco: Not on file  . Alcohol use No  . Drug use: No  . Sexual activity: Not on file   Other Topics Concern  . Not on file   Social History Narrative  . No narrative on file      Mardella Layman, MD 04/04/17 1315

## 2017-04-07 DIAGNOSIS — R1013 Epigastric pain: Secondary | ICD-10-CM | POA: Diagnosis not present

## 2017-04-07 DIAGNOSIS — M6240 Contracture of muscle, unspecified site: Secondary | ICD-10-CM | POA: Diagnosis not present

## 2017-04-07 DIAGNOSIS — K210 Gastro-esophageal reflux disease with esophagitis: Secondary | ICD-10-CM | POA: Diagnosis not present

## 2017-04-07 DIAGNOSIS — J449 Chronic obstructive pulmonary disease, unspecified: Secondary | ICD-10-CM | POA: Diagnosis not present

## 2017-07-14 DIAGNOSIS — J449 Chronic obstructive pulmonary disease, unspecified: Secondary | ICD-10-CM | POA: Diagnosis not present

## 2017-07-14 DIAGNOSIS — J019 Acute sinusitis, unspecified: Secondary | ICD-10-CM | POA: Diagnosis not present

## 2017-07-14 DIAGNOSIS — Z79899 Other long term (current) drug therapy: Secondary | ICD-10-CM | POA: Diagnosis not present

## 2017-07-14 DIAGNOSIS — K210 Gastro-esophageal reflux disease with esophagitis: Secondary | ICD-10-CM | POA: Diagnosis not present

## 2017-11-14 DIAGNOSIS — Z79891 Long term (current) use of opiate analgesic: Secondary | ICD-10-CM | POA: Diagnosis not present

## 2017-11-14 DIAGNOSIS — R7302 Impaired glucose tolerance (oral): Secondary | ICD-10-CM | POA: Diagnosis not present

## 2017-11-14 DIAGNOSIS — F17200 Nicotine dependence, unspecified, uncomplicated: Secondary | ICD-10-CM | POA: Diagnosis not present

## 2017-11-14 DIAGNOSIS — Z79899 Other long term (current) drug therapy: Secondary | ICD-10-CM | POA: Diagnosis not present

## 2017-11-14 DIAGNOSIS — Z125 Encounter for screening for malignant neoplasm of prostate: Secondary | ICD-10-CM | POA: Diagnosis not present

## 2017-11-14 DIAGNOSIS — J449 Chronic obstructive pulmonary disease, unspecified: Secondary | ICD-10-CM | POA: Diagnosis not present

## 2017-11-14 DIAGNOSIS — K210 Gastro-esophageal reflux disease with esophagitis: Secondary | ICD-10-CM | POA: Diagnosis not present

## 2017-11-14 DIAGNOSIS — E7800 Pure hypercholesterolemia, unspecified: Secondary | ICD-10-CM | POA: Diagnosis not present

## 2018-02-19 ENCOUNTER — Emergency Department: Payer: BLUE CROSS/BLUE SHIELD

## 2018-02-19 ENCOUNTER — Other Ambulatory Visit: Payer: Self-pay

## 2018-02-19 ENCOUNTER — Encounter: Payer: Self-pay | Admitting: *Deleted

## 2018-02-19 ENCOUNTER — Emergency Department
Admission: EM | Admit: 2018-02-19 | Discharge: 2018-02-19 | Disposition: A | Discharge status: Home / Self Care | Payer: BLUE CROSS/BLUE SHIELD | Attending: Emergency Medicine | Admitting: Emergency Medicine

## 2018-02-19 DIAGNOSIS — R509 Fever, unspecified: Secondary | ICD-10-CM | POA: Diagnosis not present

## 2018-02-19 DIAGNOSIS — R1111 Vomiting without nausea: Secondary | ICD-10-CM | POA: Diagnosis not present

## 2018-02-19 DIAGNOSIS — R197 Diarrhea, unspecified: Secondary | ICD-10-CM | POA: Diagnosis not present

## 2018-02-19 DIAGNOSIS — R1110 Vomiting, unspecified: Secondary | ICD-10-CM | POA: Diagnosis not present

## 2018-02-19 DIAGNOSIS — F17210 Nicotine dependence, cigarettes, uncomplicated: Secondary | ICD-10-CM | POA: Insufficient documentation

## 2018-02-19 DIAGNOSIS — K529 Noninfective gastroenteritis and colitis, unspecified: Secondary | ICD-10-CM | POA: Insufficient documentation

## 2018-02-19 LAB — CBC WITH DIFFERENTIAL/PLATELET
BASOS PCT: 0 %
Basophils Absolute: 0 10*3/uL (ref 0–0.1)
EOS ABS: 0.1 10*3/uL (ref 0–0.7)
Eosinophils Relative: 1 %
HCT: 47.3 % (ref 40.0–52.0)
HEMOGLOBIN: 16.4 g/dL (ref 13.0–18.0)
Lymphocytes Relative: 14 %
Lymphs Abs: 1.8 10*3/uL (ref 1.0–3.6)
MCH: 28 pg (ref 26.0–34.0)
MCHC: 34.7 g/dL (ref 32.0–36.0)
MCV: 80.8 fL (ref 80.0–100.0)
Monocytes Absolute: 0.3 10*3/uL (ref 0.2–1.0)
Monocytes Relative: 2 %
Neutro Abs: 10.6 10*3/uL — ABNORMAL HIGH (ref 1.4–6.5)
Neutrophils Relative %: 83 %
Platelets: 214 10*3/uL (ref 150–440)
RBC: 5.85 MIL/uL (ref 4.40–5.90)
RDW: 13.9 % (ref 11.5–14.5)
WBC: 12.9 10*3/uL — AB (ref 3.8–10.6)

## 2018-02-19 LAB — LACTIC ACID, PLASMA: LACTIC ACID, VENOUS: 2.5 mmol/L — AB (ref 0.5–1.9)

## 2018-02-19 LAB — URINALYSIS, COMPLETE (UACMP) WITH MICROSCOPIC
Bacteria, UA: NONE SEEN
Bilirubin Urine: NEGATIVE
Glucose, UA: NEGATIVE mg/dL
KETONES UR: NEGATIVE mg/dL
Leukocytes, UA: NEGATIVE
NITRITE: NEGATIVE
PROTEIN: NEGATIVE mg/dL
Specific Gravity, Urine: 1.017 (ref 1.005–1.030)
pH: 5 (ref 5.0–8.0)

## 2018-02-19 LAB — COMPREHENSIVE METABOLIC PANEL
ALK PHOS: 131 U/L — AB (ref 38–126)
ALT: 37 U/L (ref 0–44)
AST: 40 U/L (ref 15–41)
Albumin: 4.6 g/dL (ref 3.5–5.0)
Anion gap: 9 (ref 5–15)
BUN: 14 mg/dL (ref 6–20)
CALCIUM: 9.1 mg/dL (ref 8.9–10.3)
CHLORIDE: 108 mmol/L (ref 98–111)
CO2: 26 mmol/L (ref 22–32)
CREATININE: 1.39 mg/dL — AB (ref 0.61–1.24)
GFR, EST NON AFRICAN AMERICAN: 56 mL/min — AB (ref >60)
Glucose, Bld: 113 mg/dL — ABNORMAL HIGH (ref 70–99)
Potassium: 3.9 mmol/L (ref 3.5–5.1)
Sodium: 143 mmol/L (ref 135–145)
Total Bilirubin: 0.6 mg/dL (ref 0.3–1.2)
Total Protein: 7.9 g/dL (ref 6.5–8.1)

## 2018-02-19 MED ORDER — SODIUM CHLORIDE 0.9 % IV SOLN
1000.0000 mL | Freq: Once | INTRAVENOUS | Status: AC
  Administered 2018-02-19: 1000 mL via INTRAVENOUS

## 2018-02-19 MED ORDER — ONDANSETRON 4 MG PO TBDP
4.0000 mg | ORAL_TABLET | Freq: Three times a day (TID) | ORAL | 0 refills | Status: AC | PRN
Start: 2018-02-19 — End: ?

## 2018-02-19 MED ORDER — IOHEXOL 300 MG/ML  SOLN
100.0000 mL | Freq: Once | INTRAMUSCULAR | Status: AC | PRN
  Administered 2018-02-19: 100 mL via INTRAVENOUS

## 2018-02-19 NOTE — ED Notes (Signed)
Patient asked for ice or ice water. Dr. Cyril LoosenKinner aware.

## 2018-02-19 NOTE — ED Triage Notes (Signed)
Truck driver having nausea  Vomiting  Diarrhea  X  2 days   Chills

## 2018-02-19 NOTE — ED Notes (Signed)
Pt  Stood up and voided  specemin  sent to lab

## 2018-02-19 NOTE — ED Notes (Signed)
Pt  Reports  Vomited  X 3   Today   6 loose stools since yesterday   intermittant nausea worse today  Pain Rlq

## 2018-02-19 NOTE — ED Notes (Signed)
Pt to CT Scan

## 2018-02-19 NOTE — ED Notes (Signed)
Dr. Cyril LoosenKinner aware of Lactic acid of 2.5.

## 2018-02-19 NOTE — ED Provider Notes (Signed)
[facility] Emergency Department Provider Note   ____________________________________________    I have reviewed the triage vital signs and the nursing notes.   HISTORY  Chief Complaint Emesis     HPI Melvin King is a 57 y.o. male who presents with nausea and vomiting.  Patient reports he developed chills, nausea and vomiting today.  He denies significant abdominal pain.  No sick contacts reported.  He has not taken anything for this.  No recent travel out of the country or camping.  Did have 4 episodes of loose stool last night.  No hematemesis   Past Medical History:  Diagnosis Date  . Hyperlipidemia     Patient Active Problem List   Diagnosis Date Noted  . 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 HYPERCHOLESTEROLEMIA 01/20/2010  . TOBACCO ABUSE 01/20/2010  . HYPERTENSION, BORDERLINE 01/20/2010  . PNEUMONIA, ORGAN UNSPECIFIED 01/20/2010  . GERD 01/20/2010  . MYALGIA 01/20/2010  . FATIGUE 01/20/2010  . SHORTNESS OF BREATH (SOB) 01/20/2010    History reviewed. No pertinent surgical history.  Prior to Admission medications   Medication Sig Start Date End Date Taking? Authorizing Provider  atorvastatin (LIPITOR) 20 MG tablet Take 20 mg by mouth at bedtime. 11/14/17  Yes [provider]  cyclobenzaprine (FLEXERIL) 10 MG tablet Take 1 tablet (10 mg total) by mouth 3 (three) times daily. Patient not taking: Reported on 02/19/2018 04/04/17   Mardella LaymanHagler, Brian, MD  diclofenac (VOLTAREN) 75 MG EC tablet Take 1 tablet (75 mg total) by mouth 2 (two) times daily. Patient not taking: Reported on 02/19/2018 04/04/17   Mardella LaymanHagler, Brian, MD  guaiFENesin-codeine [facility](ROBITUSSIN AC) 100-10 MG/5ML syrup Take 5 mLs by mouth 3 (three) times daily as needed for cough. Patient not taking: Reported on 02/19/2018 08/15/12   Marlon PelGreene, Tiffany, PA-C  ondansetron (ZOFRAN ODT) 4 MG disintegrating tablet Take 1 tablet (4 mg total) by mouth every 8 (eight) hours as  needed for nausea or vomiting. 02/19/18   Jene EveryKinner, Erastus Bartolomei, MD     Allergies Patient has no known allergies.  No family history on file.  Social History Social History   Tobacco Use  . Smoking status: Current Every Day Smoker    Packs/day: 1.00    Types: Cigarettes  Substance Use Topics  . Alcohol use: No  . Drug use: No    Review of Systems  Constitutional: No fever/chills Eyes: No visual changes.  ENT: No sore throat. Cardiovascular: Denies chest pain. Respiratory: Denies shortness of breath. no Cough Gastrointestinal: As above  Genitourinary: No dysuria Musculoskeletal: Negative for back pain. Skin: Negative for rash. Neurological: Negative for headaches or weakness   ____________________________________________   PHYSICAL EXAM:  VITAL SIGNS: ED Triage Vitals  Enc Vitals Group     BP 02/19/18 1303 134/80     Pulse Rate 02/19/18 1253 (!) 128     Resp 02/19/18 1253 (!) 26     Temp 02/19/18 1253 97.6 F (36.4 C)     Temp Source 02/19/18 1253 Oral     SpO2 02/19/18 1303 94 %     Weight 02/19/18 1254 93 kg (205 lb)     Height 02/19/18 1254 1.93 m (6\' 4" )     Head Circumference --      Peak Flow --      Pain Score 02/19/18 1254 0     Pain Loc --      Pain Edu? --      Excl. in GC? --  Constitutional: Alert and oriented. No acute distress. Pleasant and interactive Eyes: Conjunctivae are normal.   Nose: No congestion/rhinnorhea. Mouth/Throat: Mucous membranes are moist.    Cardiovascular: Normal rate, regular rhythm. Grossly normal heart sounds.  Good peripheral circulation. Respiratory: Normal respiratory effort.  No retractions. Lungs CTAB. Gastrointestinal: Soft and nontender. No distention.   Musculoskeletal: No lower extremity tenderness nor edema.  Warm and well perfused Neurologic:  Normal speech and language. No gross focal neurologic deficits are appreciated.  Skin:  Skin is warm, dry and intact. No rash noted. Psychiatric: Mood and affect  are normal. Speech and behavior are normal.  ____________________________________________   LABS (all labs ordered are listed, but only abnormal results are displayed)  Labs Reviewed  LACTIC ACID, PLASMA - Abnormal; Notable for the following components:      Result Value   Lactic Acid, Venous 2.5 (*)    All other components within normal limits  COMPREHENSIVE METABOLIC PANEL - Abnormal; Notable for the following components:   Glucose, Bld 113 (*)    Creatinine, Ser 1.39 (*)    Alkaline Phosphatase 131 (*)    GFR calc non Af Amer 56 (*)    All other components within normal limits  CBC WITH DIFFERENTIAL/PLATELET - Abnormal; Notable for the following components:   WBC 12.9 (*)    Neutro Abs 10.6 (*)    All other components within normal limits  URINALYSIS, COMPLETE (UACMP) WITH MICROSCOPIC - Abnormal; Notable for the following components:   Color, Urine YELLOW (*)    APPearance CLEAR (*)    Hgb urine dipstick LARGE (*)    All other components within normal limits  URINE CULTURE  LACTIC ACID, PLASMA   ____________________________________________  EKG  None ____________________________________________  RADIOLOGY  CT abdomen pelvis shows some bladder thickening discussed with patient otherwise no acute findings ____________________________________________   PROCEDURES  Procedure(s) performed: No  Procedures   Critical Care performed: No ____________________________________________   INITIAL IMPRESSION / ASSESSMENT AND PLAN / ED COURSE  Pertinent labs & imaging results that were available during my care of the patient were reviewed by me and considered in my medical decision making (see chart for details).  Patient presents with nausea and vomiting, abdominal exam is overall reassuring.  Will treat with IV Zofran, IV fluids check labs and reevaluate  Mildly elevated white blood cell count is nonspecific, patient is afebrile.  Suspect lactic acid is related to  acute GI illness.  CT scan obtained which shows no acute findings, discussed bladder thickening and hematuria with the patient and the need for outpatient urological follow-up given the concern for possible bladder cancer  ----------------------------------------- 3:12 PM on 02/19/2018 -----------------------------------------  After fluids patient feels significantly better, he describes that diarrhea started 2 days ago and the nausea vomiting started this morning, this is most consistent with viral gastroenteritis.  Given that he feels significantly better and has not had any more vomiting in the emergency department appropriate for discharge with strict return precautions.  He will follow-up with urology    ____________________________________________   FINAL CLINICAL IMPRESSION(S) / ED DIAGNOSES  Final diagnoses:  Gastroenteritis        Note:  This document was prepared using Dragon voice recognition software and may include unintentional dictation errors.    Jene Every, MD 02/19/18 1517

## 2018-02-20 DIAGNOSIS — K210 Gastro-esophageal reflux disease with esophagitis: Secondary | ICD-10-CM | POA: Diagnosis not present

## 2018-02-20 DIAGNOSIS — Z79891 Long term (current) use of opiate analgesic: Secondary | ICD-10-CM | POA: Diagnosis not present

## 2018-02-20 DIAGNOSIS — J449 Chronic obstructive pulmonary disease, unspecified: Secondary | ICD-10-CM | POA: Diagnosis not present

## 2018-02-20 DIAGNOSIS — K529 Noninfective gastroenteritis and colitis, unspecified: Secondary | ICD-10-CM | POA: Diagnosis not present

## 2018-02-21 LAB — URINE CULTURE

## 2018-03-14 ENCOUNTER — Ambulatory Visit: Payer: BLUE CROSS/BLUE SHIELD | Admitting: Urology

## 2018-03-14 ENCOUNTER — Encounter: Payer: Self-pay | Admitting: Urology

## 2018-03-14 VITALS — BP 114/71 | HR 77 | Ht 76.0 in | Wt 231.1 lb

## 2018-03-14 DIAGNOSIS — R319 Hematuria, unspecified: Secondary | ICD-10-CM | POA: Diagnosis not present

## 2018-03-14 NOTE — Progress Notes (Signed)
   03/14/2018 4:25 PM   Jesusita Oka Sep 10, 1960 [id number]  Referring provider: Corine Shelter, MD [address] [place], [place] [address]  CC: Gross hematuria  HPI: I had the pleasure of seeing Mr. Kope in urology clinic today for gross hematuria.  Briefly, he is a 57 year old male with a 30-pack-year smoking history who is noted some rust colored urine intermittently over the last few weeks.  He denies any blood clots in the urine.  The severity is mild.  There are no aggravating or alleviating factors.  He denies history of urinary tract infections.  He is a [employer] and does have occasional urgency and even urge incontinence, though he consumes energy drinks and sodas while he is on the road.  He denies family history of prostate cancer.  CT scan of the abdomen/pelvis with contrast in the emergency room for recent work-up of nausea/vomiting did not show any concerning urologic findings.   PMH: Past Medical History:  Diagnosis Date  . GERD (gastroesophageal reflux disease)   . Hyperlipidemia     Surgical History: History reviewed. No pertinent surgical history.   Allergies: No Known Allergies  Family History: Family History  Problem Relation Age of Onset  . Bladder Cancer Neg Hx   . Kidney cancer Neg Hx     Social History:  reports that he has been smoking cigarettes. He has been smoking about 1.00 pack per day. He has never used smokeless tobacco. He reports that he does not drink alcohol or use drugs.  ROS: Please see flowsheet from today's date for complete review of systems.  Physical Exam: BP 114/71 (BP Location: Left Arm, Patient Position: Sitting, Cuff Size: Normal)   Pulse 77   Ht 6\' 4"  (1.93 m)   Wt 231 lb 1.6 oz (104.8 kg)   BMI 28.13 kg/m    Constitutional:  Alert and oriented, No acute distress. Cardiovascular: No clubbing, cyanosis, or edema. Respiratory: Normal respiratory effort, no increased work of breathing. GI: Abdomen is  soft, nontender, nondistended, no abdominal masses GU: No CVA tenderness, phallus without lesions, widely patent meatus Lymph: No cervical or inguinal lymphadenopathy. Skin: No rashes, bruises or suspicious lesions. Neurologic: Grossly intact, no focal deficits, moving all 4 extremities. Psychiatric: Normal mood and affect.  Pertinent Imaging: I have personally reviewed the CT abdomen pelvis from 02/19/2018.  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  CT scan with contrast showed mildly thickened bladder wall.  We also discussed behavioral strategies including reducing caffeine, soda, and energy drinks regarding his urinary urgency.  We discussed common possible etiologies of hematuria including BPH, malignancy, urolithiasis, medical renal disease, and idiopathic. Standard workup recommended by the [facility] includes imaging with CT urogram to assess the upper tracts, and cystoscopy. Cytology is performed on patient's with gross hematuria to look for malignant cells in the urine.  Return for cysto.  Sondra Come, MD  [facility] [address], [address] [place], [place] [address] [phone]

## 2018-03-14 NOTE — Patient Instructions (Signed)
Cystoscopy  Cystoscopy is a procedure that is used to help diagnose and sometimes treat conditions that affect that lower urinary tract. The lower urinary tract includes the bladder and the tube that drains urine from the bladder out of the body (urethra). Cystoscopy is performed with a thin, tube-shaped instrument with a light and camera at the end (cystoscope). The cystoscope may be hard (rigid) or flexible, depending on the goal of the procedure.The cystoscope is inserted through the urethra, into the bladder.  Cystoscopy may be recommended if you have:   Urinary tractinfections that keep coming back (recurring).   Blood in the urine (hematuria).   Loss of bladder control (urinary incontinence) or an overactive bladder.   Unusual cells found in a urine sample.   A blockage in the urethra.   Painful urination.   An abnormality in the bladder found during an intravenous pyelogram (IVP) or CT scan.    Cystoscopy may also be done to remove a sample of tissue to be examined under a microscope (biopsy).  Tell a health care provider about:   Any allergies you have.   All medicines you are taking, including vitamins, herbs, eye drops, creams, and over-the-counter medicines.   Any problems you or family members have had with anesthetic medicines.   Any blood disorders you have.   Any surgeries you have had.   Any medical conditions you have.   Whether you are pregnant or may be pregnant.  What are the risks?  Generally, this is a safe procedure. However, problems may occur, including:   Infection.   Bleeding.   Allergic reactions to medicines.   Damage to other structures or organs.    What happens before the procedure?   Ask your health care provider about:  ? Changing or stopping your regular medicines. This is especially important if you are taking diabetes medicines or blood thinners.  ? Taking medicines such as aspirin and ibuprofen. These medicines can thin your blood. Do not take these medicines  before your procedure if your health care provider instructs you not to.   Follow instructions from your health care provider about eating or drinking restrictions.   You may be given antibiotic medicine to help prevent infection.   You may have an exam or testing, such as X-rays of the bladder, urethra, or kidneys.   You may have urine tests to check for signs of infection.   Plan to have someone take you home after the procedure.  What happens during the procedure?   To reduce your risk of infection,your health care team will wash or sanitize their hands.   You will be given one or more of the following:  ? A medicine to help you relax (sedative).  ? A medicine to numb the area (local anesthetic).   The area around the opening of your urethra will be cleaned.   The cystoscope will be passed through your urethra into your bladder.   Germ-free (sterile)fluid will flow through the cystoscope to fill your bladder. The fluid will stretch your bladder so that your surgeon can clearly examine your bladder walls.   The cystoscope will be removed and your bladder will be emptied.  The procedure may vary among health care providers and hospitals.  What happens after the procedure?   You may have some soreness or pain in your abdomen and urethra. Medicines will be available to help you.   You may have some blood in your urine.   Do not   drive for 24 hours if you received a sedative.  This information is not intended to replace advice given to you by your health care provider. Make sure you discuss any questions you have with your health care provider.  Document Released: 06/25/2000 Document Revised: 11/06/2015 Document Reviewed: 05/15/2015  Elsevier Interactive Patient Education  2018 [employer]

## 2018-03-15 LAB — URINALYSIS, COMPLETE
Bilirubin, UA: NEGATIVE
Glucose, UA: NEGATIVE
Ketones, UA: NEGATIVE
Leukocytes, UA: NEGATIVE
NITRITE UA: NEGATIVE
PH UA: 7.5 (ref 5.0–7.5)
Specific Gravity, UA: 1.02 (ref 1.005–1.030)
UUROB: 1 mg/dL (ref 0.2–1.0)

## 2018-03-15 LAB — MICROSCOPIC EXAMINATION: BACTERIA UA: NONE SEEN

## 2018-04-07 ENCOUNTER — Encounter: Payer: Self-pay | Admitting: Urology

## 2018-04-07 ENCOUNTER — Ambulatory Visit: Payer: BLUE CROSS/BLUE SHIELD | Admitting: Urology

## 2018-04-07 VITALS — BP 114/75 | HR 68 | Resp 16 | Ht 75.5 in | Wt 226.4 lb

## 2018-04-07 DIAGNOSIS — R319 Hematuria, unspecified: Secondary | ICD-10-CM

## 2018-04-07 LAB — URINALYSIS, COMPLETE
Bilirubin, UA: NEGATIVE
Glucose, UA: NEGATIVE
Ketones, UA: NEGATIVE
Nitrite, UA: NEGATIVE
PROTEIN UA: NEGATIVE
Specific Gravity, UA: >1.03 — ABNORMAL HIGH (ref 1.005–1.030)
Urobilinogen, Ur: 0.2 mg/dL (ref 0.2–1.0)
pH, UA: 5.5 (ref 5.0–7.5)

## 2018-04-07 LAB — MICROSCOPIC EXAMINATION

## 2018-04-07 NOTE — Progress Notes (Signed)
Cystoscopy Procedure Note:  Indication: Hematuria  After informed consent and discussion of the procedure and its risks, Melvin King was positioned and prepped in the standard fashion. Cystoscopy was performed with a flexible cystoscope. The urethra, bladder neck and entire bladder was visualized in a standard fashion. The prostate was moderate in size. The ureteral orifices were visualized in their normal location and orientation. Small <1cm diverticulum at posterior wall. No tumors or concerning findings.  Findings: Normal cystoscopy  Assessment and Plan: Follow up PRN  Legrand Rams, MD 04/07/2018

## 2018-07-03 DIAGNOSIS — E7800 Pure hypercholesterolemia, unspecified: Secondary | ICD-10-CM | POA: Diagnosis not present

## 2018-07-03 DIAGNOSIS — R7302 Impaired glucose tolerance (oral): Secondary | ICD-10-CM | POA: Diagnosis not present

## 2018-07-03 DIAGNOSIS — K529 Noninfective gastroenteritis and colitis, unspecified: Secondary | ICD-10-CM | POA: Diagnosis not present

## 2018-07-03 DIAGNOSIS — J441 Chronic obstructive pulmonary disease with (acute) exacerbation: Secondary | ICD-10-CM | POA: Diagnosis not present

## 2018-07-03 DIAGNOSIS — Z79891 Long term (current) use of opiate analgesic: Secondary | ICD-10-CM | POA: Diagnosis not present

## 2018-07-03 DIAGNOSIS — Z79899 Other long term (current) drug therapy: Secondary | ICD-10-CM | POA: Diagnosis not present

## 2018-07-03 DIAGNOSIS — K210 Gastro-esophageal reflux disease with esophagitis: Secondary | ICD-10-CM | POA: Diagnosis not present

## 2018-07-07 DIAGNOSIS — R1010 Upper abdominal pain, unspecified: Secondary | ICD-10-CM | POA: Diagnosis not present

## 2018-11-06 DIAGNOSIS — K5900 Constipation, unspecified: Secondary | ICD-10-CM | POA: Diagnosis not present

## 2018-11-06 DIAGNOSIS — K210 Gastro-esophageal reflux disease with esophagitis: Secondary | ICD-10-CM | POA: Diagnosis not present

## 2018-11-06 DIAGNOSIS — E7800 Pure hypercholesterolemia, unspecified: Secondary | ICD-10-CM | POA: Diagnosis not present

## 2018-11-06 DIAGNOSIS — J441 Chronic obstructive pulmonary disease with (acute) exacerbation: Secondary | ICD-10-CM | POA: Diagnosis not present

## 2018-12-18 DIAGNOSIS — J449 Chronic obstructive pulmonary disease, unspecified: Secondary | ICD-10-CM | POA: Diagnosis not present

## 2018-12-18 DIAGNOSIS — K210 Gastro-esophageal reflux disease with esophagitis: Secondary | ICD-10-CM | POA: Diagnosis not present

## 2018-12-18 DIAGNOSIS — R7302 Impaired glucose tolerance (oral): Secondary | ICD-10-CM | POA: Diagnosis not present

## 2018-12-18 DIAGNOSIS — Z125 Encounter for screening for malignant neoplasm of prostate: Secondary | ICD-10-CM | POA: Diagnosis not present

## 2018-12-18 DIAGNOSIS — K529 Noninfective gastroenteritis and colitis, unspecified: Secondary | ICD-10-CM | POA: Diagnosis not present

## 2018-12-18 DIAGNOSIS — Z79899 Other long term (current) drug therapy: Secondary | ICD-10-CM | POA: Diagnosis not present

## 2018-12-18 DIAGNOSIS — E7800 Pure hypercholesterolemia, unspecified: Secondary | ICD-10-CM | POA: Diagnosis not present

## 2019-04-30 DIAGNOSIS — R1013 Epigastric pain: Secondary | ICD-10-CM | POA: Diagnosis not present

## 2019-04-30 DIAGNOSIS — J449 Chronic obstructive pulmonary disease, unspecified: Secondary | ICD-10-CM | POA: Diagnosis not present

## 2019-04-30 DIAGNOSIS — Z79891 Long term (current) use of opiate analgesic: Secondary | ICD-10-CM | POA: Diagnosis not present

## 2019-04-30 DIAGNOSIS — K2100 Gastro-esophageal reflux disease with esophagitis, without bleeding: Secondary | ICD-10-CM | POA: Diagnosis not present

## 2019-05-08 DIAGNOSIS — S29012A Strain of muscle and tendon of back wall of thorax, initial encounter: Secondary | ICD-10-CM | POA: Diagnosis not present

## 2019-08-27 DIAGNOSIS — E7800 Pure hypercholesterolemia, unspecified: Secondary | ICD-10-CM | POA: Diagnosis not present

## 2019-08-27 DIAGNOSIS — Z8042 Family history of malignant neoplasm of prostate: Secondary | ICD-10-CM | POA: Diagnosis not present

## 2019-08-27 DIAGNOSIS — Z139 Encounter for screening, unspecified: Secondary | ICD-10-CM | POA: Diagnosis not present

## 2019-08-27 DIAGNOSIS — F17200 Nicotine dependence, unspecified, uncomplicated: Secondary | ICD-10-CM | POA: Diagnosis not present

## 2019-08-27 DIAGNOSIS — Z79899 Other long term (current) drug therapy: Secondary | ICD-10-CM | POA: Diagnosis not present

## 2019-08-27 DIAGNOSIS — E7849 Other hyperlipidemia: Secondary | ICD-10-CM | POA: Diagnosis not present

## 2019-08-27 DIAGNOSIS — J449 Chronic obstructive pulmonary disease, unspecified: Secondary | ICD-10-CM | POA: Diagnosis not present

## 2019-08-27 DIAGNOSIS — K2100 Gastro-esophageal reflux disease with esophagitis, without bleeding: Secondary | ICD-10-CM | POA: Diagnosis not present

## 2019-08-27 DIAGNOSIS — Z79891 Long term (current) use of opiate analgesic: Secondary | ICD-10-CM | POA: Diagnosis not present

## 2019-08-27 DIAGNOSIS — R7302 Impaired glucose tolerance (oral): Secondary | ICD-10-CM | POA: Diagnosis not present

## 2019-12-24 DIAGNOSIS — J441 Chronic obstructive pulmonary disease with (acute) exacerbation: Secondary | ICD-10-CM | POA: Diagnosis not present

## 2019-12-24 DIAGNOSIS — Z79899 Other long term (current) drug therapy: Secondary | ICD-10-CM | POA: Diagnosis not present

## 2019-12-24 DIAGNOSIS — K2100 Gastro-esophageal reflux disease with esophagitis, without bleeding: Secondary | ICD-10-CM | POA: Diagnosis not present

## 2019-12-24 DIAGNOSIS — F17200 Nicotine dependence, unspecified, uncomplicated: Secondary | ICD-10-CM | POA: Diagnosis not present

## 2020-03-01 IMAGING — CT CT ABD-PELV W/ CM
2 of 5 series · 16 of 46 positions shown, 18 images · IV contrast (APPLIED)
Comparison: None.

CLINICAL DATA: Right lower quadrant and umbilical region pain today
with nausea, vomiting, diarrhea and fever. Elevated white blood cell
count

EXAM:
CT ABDOMEN AND PELVIS WITH CONTRAST
TECHNIQUE: Multidetector CT imaging of the abdomen and pelvis was performed
using the standard protocol following bolus administration of
intravenous contrast.
CONTRAST:  100mL OMNIPAQUE IOHEXOL 300 MG/ML  SOLN

[Series 2: routine abd/pel with · axial · 0.81mm/px · z∈[-1132,-672]mm · 13 of 104 slices shown, 15 images]
[im 6/104  soft-tissue]
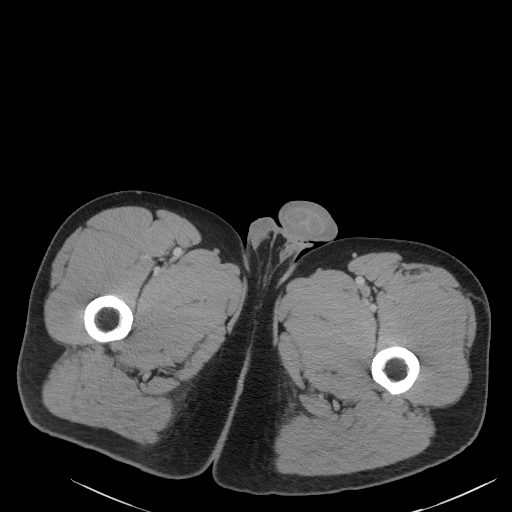
[im 6/104  bone]
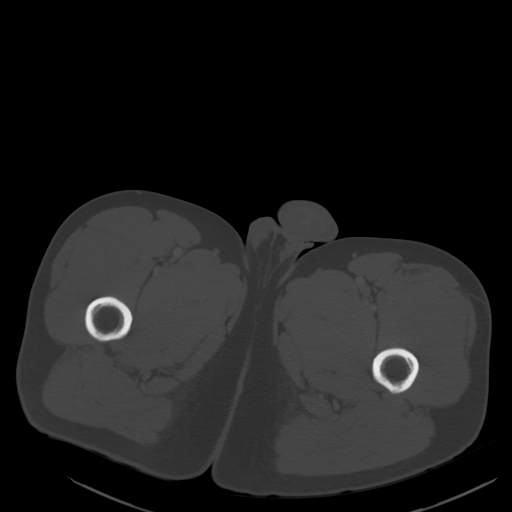
[im 16/104  soft-tissue]
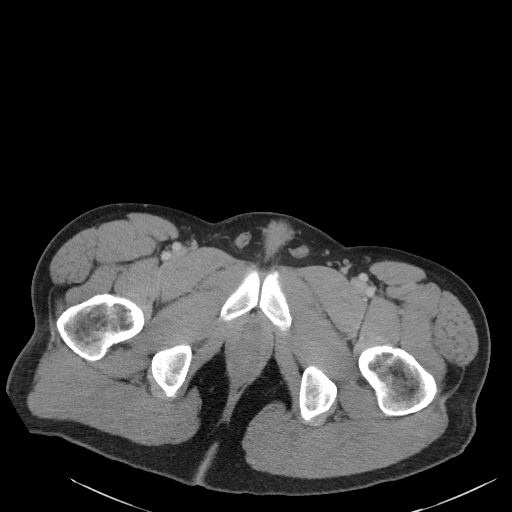
[im 21/104  soft-tissue]
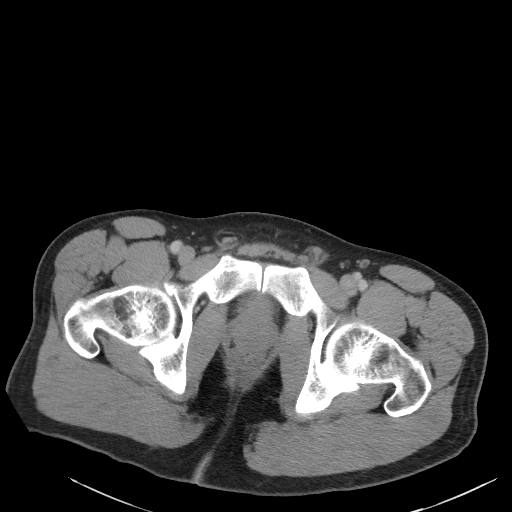
[im 31/104  soft-tissue]
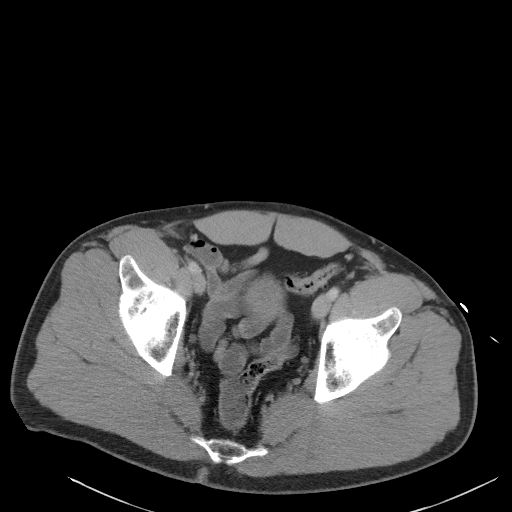
[im 37/104  soft-tissue]
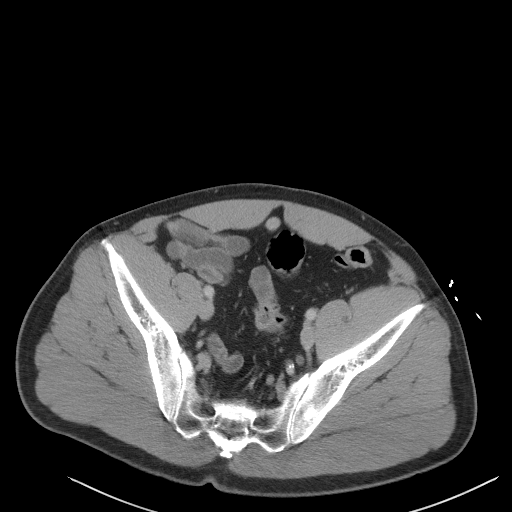
[im 47/104  soft-tissue]
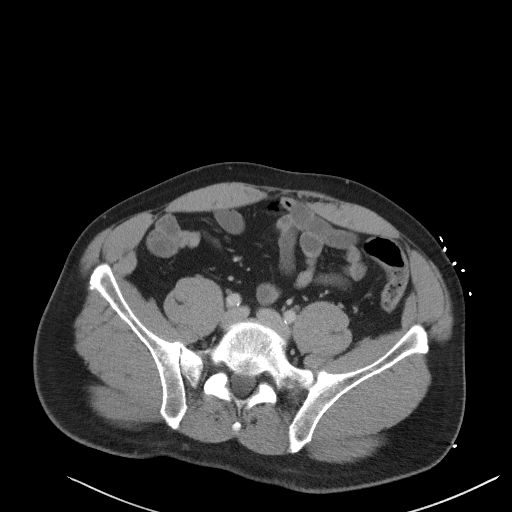
[im 52/104  soft-tissue]
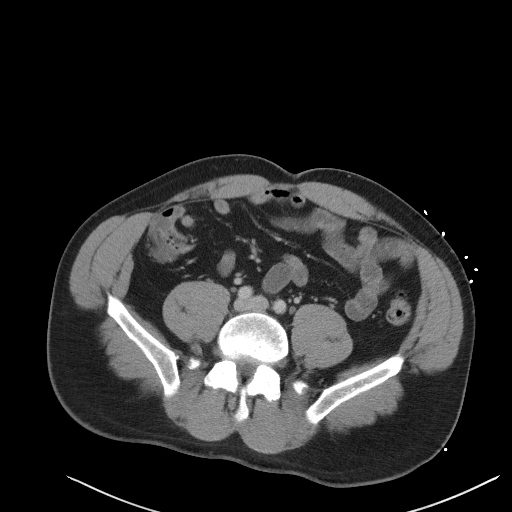
[im 57/104  soft-tissue]
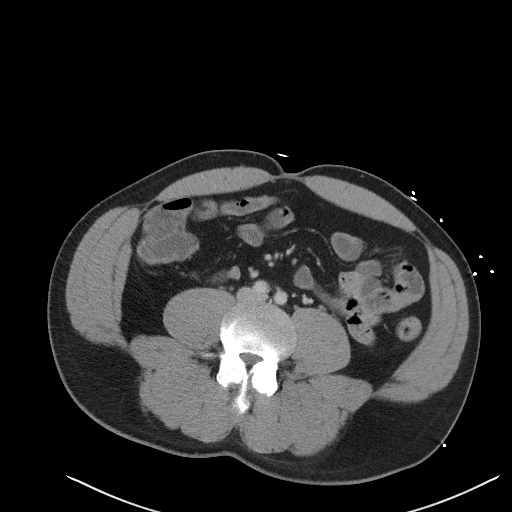
[im 67/104  soft-tissue]
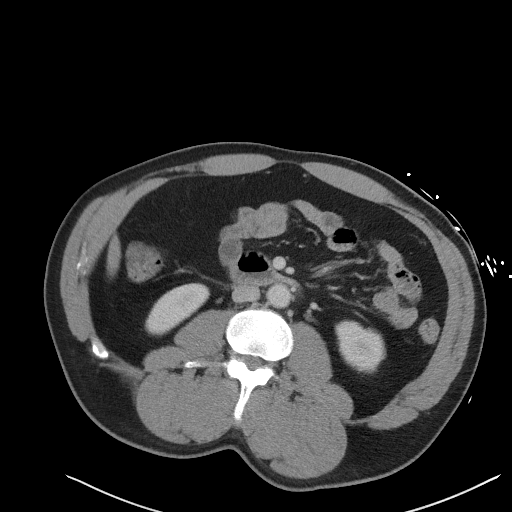
[im 67/104  bone]
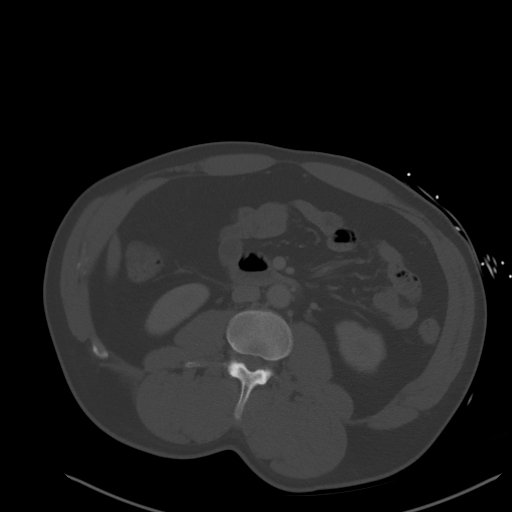
[im 73/104  soft-tissue]
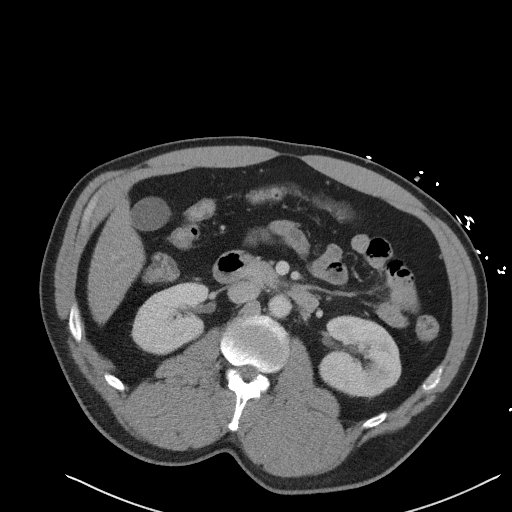
[im 83/104  soft-tissue]
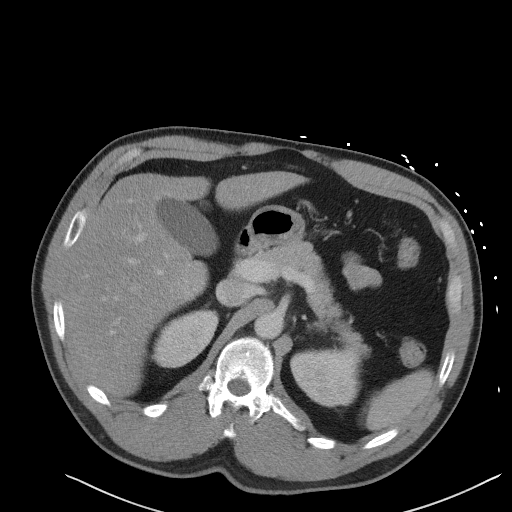
[im 88/104  soft-tissue]
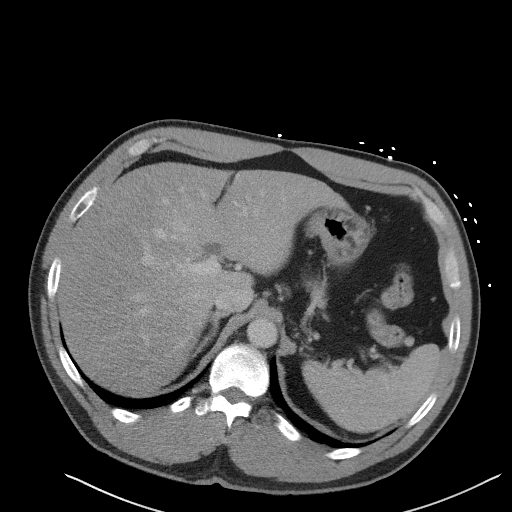
[im 98/104  soft-tissue]
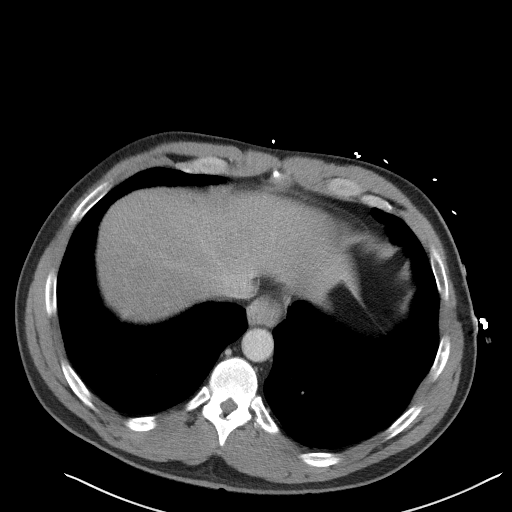

[Series 6: coronal st · coronal · 0.75mm/px · 3 of 91 slices shown]
[im 31/91  soft-tissue]
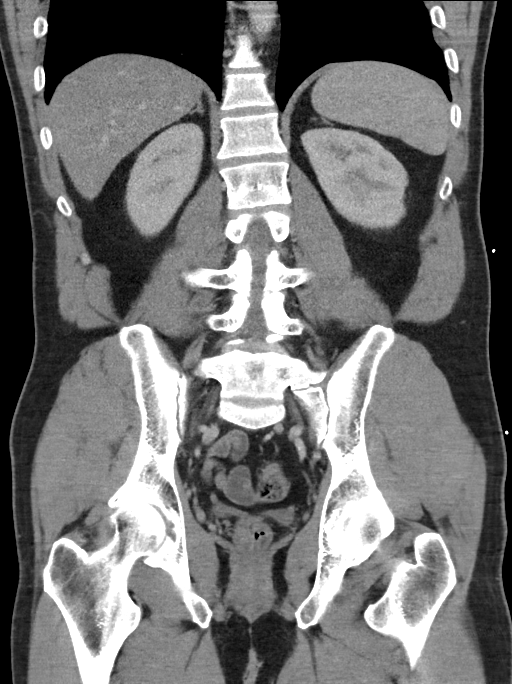
[im 41/91  soft-tissue]
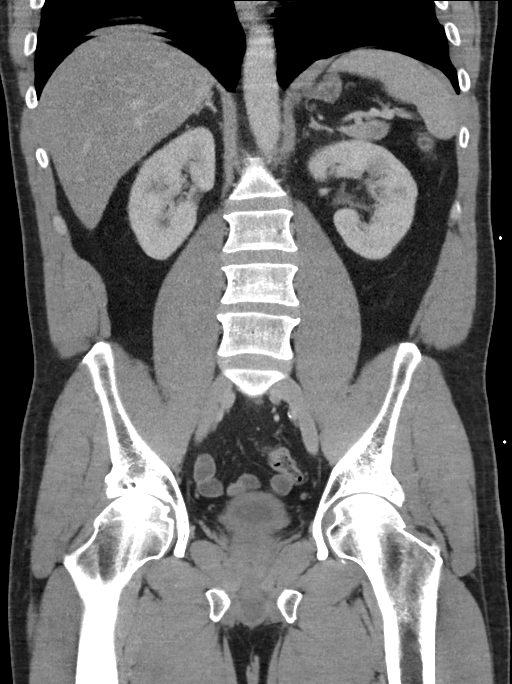
[im 51/91  soft-tissue]
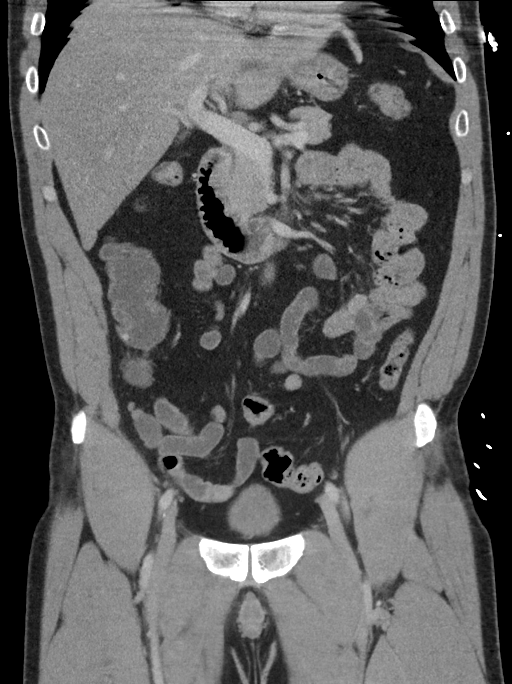

[16 of 46 positions shown; findings below may reference images not displayed]

FINDINGS: Lower chest: Clear lung bases.  Heart normal size.

Hepatobiliary: Decreased attenuation of the liver consistent with
diffuse fatty infiltration. Liver normal in size. No masses or focal
lesions. Normal gallbladder. No bile duct dilation.

Pancreas: Unremarkable. No pancreatic ductal dilatation or
surrounding inflammatory changes.

Spleen: Normal in size without focal abnormality.

Adrenals/Urinary Tract: Adrenal glands are unremarkable. Kidneys are
normal, without renal calculi, focal lesion, or hydronephrosis.

Bladder wall appears mildly thickened.  No bladder mass or stone.

Stomach/Bowel: Stomach is within normal limits. Appendix appears
normal. No evidence of bowel wall thickening, distention, or
inflammatory changes.

Vascular/Lymphatic: Aortic atherosclerosis and mild ectasia, maximum
AP diameter 2.4 cm. No adenopathy.

Reproductive: Prostate is unremarkable.

Other: No abdominal wall hernia or abnormality. No abdominopelvic
ascites.

Musculoskeletal: No fracture or acute finding. No osteoblastic or
osteolytic lesions.
IMPRESSION: 1. Bladder wall appears mildly thickened. Consider cystitis if there
are consistent clinical findings.
2. No other evidence of an acute abnormality within the abdomen or
pelvis. A normal appendix is visualized.
3. Aortic atherosclerosis.
4. Hepatic steatosis.

## 2020-03-24 DIAGNOSIS — F17200 Nicotine dependence, unspecified, uncomplicated: Secondary | ICD-10-CM | POA: Diagnosis not present

## 2020-03-24 DIAGNOSIS — Z79899 Other long term (current) drug therapy: Secondary | ICD-10-CM | POA: Diagnosis not present

## 2020-03-24 DIAGNOSIS — K2100 Gastro-esophageal reflux disease with esophagitis, without bleeding: Secondary | ICD-10-CM | POA: Diagnosis not present

## 2020-03-24 DIAGNOSIS — J441 Chronic obstructive pulmonary disease with (acute) exacerbation: Secondary | ICD-10-CM | POA: Diagnosis not present

## 2021-02-23 ENCOUNTER — Other Ambulatory Visit: Payer: Self-pay

## 2021-02-23 ENCOUNTER — Ambulatory Visit
Admission: RE | Admit: 2021-02-23 | Discharge: 2021-02-23 | Disposition: A | Discharge status: Home / Self Care | Payer: BC Managed Care – PPO | Source: Ambulatory Visit | Attending: Emergency Medicine | Admitting: Emergency Medicine

## 2021-02-23 VITALS — BP 145/96 | HR 84 | Temp 98.0°F | Resp 18

## 2021-02-23 DIAGNOSIS — N390 Urinary tract infection, site not specified: Secondary | ICD-10-CM | POA: Diagnosis not present

## 2021-02-23 LAB — POCT URINALYSIS DIP (MANUAL ENTRY)
Bilirubin, UA: NEGATIVE
Glucose, UA: 100 mg/dL — AB
Nitrite, UA: POSITIVE — AB
Spec Grav, UA: 1.02 (ref 1.010–1.025)
Urobilinogen, UA: 2 E.U./dL — AB
pH, UA: 6.5 (ref 5.0–8.0)

## 2021-02-23 MED ORDER — SULFAMETHOXAZOLE-TRIMETHOPRIM 800-160 MG PO TABS: 1.0000 | ORAL_TABLET | Freq: Two times a day (BID) | ORAL | 0 refills | Status: AC

## 2021-02-23 NOTE — ED Triage Notes (Signed)
Pt here with blood in urine and painful urination since Friday. Took something over the counter for relief and does not remember what, but it is not working.

## 2021-02-23 NOTE — ED Provider Notes (Signed)
Subjective:    Melvin King is a very pleasant 60 y.o. male who presents with concerns for UTI due to blood in urine and burning with urination that started on Friday.  Patient reports taking generic Azo over-the-counter, but states that that has not seemed to help. No unilateral back pain, vomiting, fever, vaginal discharge, concern for STD.  No testicular or perineal pain.  Past medical history, past surgical history, current medications reviewed.  Allergies: has No Known Allergies.  Review of Systems See HPI   Objective:     Vitals:   02/23/21 1811  BP: (!) 145/96  Pulse: 84  Resp: 18  Temp: 98 F (36.7 C)  SpO2: 100%     General: Appears well-developed and well-nourished. No acute distress.  Cardiovascular: Normal rate  Pulm/Chest: No respiratory distress.  Abdominal: No CVAT.  Neurological: Alert and oriented to person, place, and time.  Skin: Skin is warm and dry.  Psychiatric: Normal mood, affect, behavior, and thought content.  GU:  Deferred secondary to self collect specimen.  Laboratory:  Orders Placed This Encounter  Procedures   Urine Culture   POCT urinalysis dipstick   Results for orders placed or performed during the hospital encounter of 02/23/21  POCT urinalysis dipstick  Result Value Ref Range   Color, UA orange (A) yellow   Clarity, UA clear clear   Glucose, UA =100 (A) negative mg/dL   Bilirubin, UA negative negative   Ketones, POC UA trace (5) (A) negative mg/dL   Spec Grav, UA 3.557 3.220 - 1.025   Blood, UA trace-intact (A) negative   pH, UA 6.5 5.0 - 8.0   Protein Ur, POC trace (A) negative mg/dL   Urobilinogen, UA 2.0 (A) 0.2 or 1.0 E.U./dL   Nitrite, UA Positive (A) Negative   Leukocytes, UA Trace (A) Negative    -Urinalysis reveals orange urine, 100 glucose, trace ketones, trace blood, trace protein, 2 urobilinogen, positive nitrite and trace leukocytes. -Urine culture pending. Assessment:   1. Acute UTI - Urine Culture;  Standing - Urine Culture - sulfamethoxazole-trimethoprim (BACTRIM DS) 800-160 MG tablet; Take 1 tablet by mouth 2 (two) times daily for 7 days.  Dispense: 14 tablet; Refill: 0   Plan:   MDM: Patient presents with concerns for UTI due to blood in urine and burning with urination that started on Friday.  Patient reports taking generic Azo over-the-counter, but states that that has not seemed to help. No unilateral back pain, vomiting, fever, vaginal discharge, concern for STD.  No testicular or perineal pain.  Chart review completed.  Urine culture pending.  Urinalysis reveals orange urine, 100 glucose, trace ketones, trace blood, trace protein, 2 urobilinogen, positive nitrite and trace leukocytes.  Given symptoms and urinalysis results, will treat as acute UTI with Bactrim twice daily x7 days.  Advised that he may continue use of Azo or Uristat over-the-counter to help with discomfort.  Did advise that we will call with any results from his urine culture that require alternative antimicrobial therapy.  Advised of strict return/ED precautions for worsening of symptoms to include unilateral back pain, fever or vomiting.  Patient verbalized understanding and agreed with plan.  Patient stable upon discharge.    Discharge Instructions      You were seen today for an infection in the lower urinary tract. Your urine sample today was sent for a culture. The urine culture will show what type of bacteria grows and if you are on the appropriate antibiotic. If the antibiotic needs  to be changed, you will receive a phone call from the follow up nurse who will give you more information. If you do not receive a call, then you are on the correct antibiotic.  Take antibiotics as directed. Finish course even if feeling better sooner. Drink plenty of clear fluids. Over the counter "Uristat" or "Azo Standard" may help your discomfort.  This will turn your urine dark orange or red and may stain contacts (remove before  using). You may experience 24 - 48 hours of continuing discomfort until medication controls the infection.  Return to clinic or go to the ER if you develop a fever, one-sided back pain, or vomiting as these are signs of a worsening infection.          Amalia Greenhouse, FNP 02/23/21 1835

## 2021-02-23 NOTE — Discharge Instructions (Addendum)

## 2021-02-25 LAB — URINE CULTURE: Culture: 20000 — AB

## 2021-03-30 DIAGNOSIS — F17200 Nicotine dependence, unspecified, uncomplicated: Secondary | ICD-10-CM | POA: Diagnosis not present

## 2021-03-30 DIAGNOSIS — Z79899 Other long term (current) drug therapy: Secondary | ICD-10-CM | POA: Diagnosis not present

## 2021-03-30 DIAGNOSIS — K2100 Gastro-esophageal reflux disease with esophagitis, without bleeding: Secondary | ICD-10-CM | POA: Diagnosis not present

## 2021-03-30 DIAGNOSIS — E785 Hyperlipidemia, unspecified: Secondary | ICD-10-CM | POA: Diagnosis not present

## 2021-03-30 DIAGNOSIS — R319 Hematuria, unspecified: Secondary | ICD-10-CM | POA: Diagnosis not present

## 2021-03-30 DIAGNOSIS — K599 Functional intestinal disorder, unspecified: Secondary | ICD-10-CM | POA: Diagnosis not present

## 2021-03-30 DIAGNOSIS — Z79891 Long term (current) use of opiate analgesic: Secondary | ICD-10-CM | POA: Diagnosis not present

## 2021-03-30 DIAGNOSIS — J449 Chronic obstructive pulmonary disease, unspecified: Secondary | ICD-10-CM | POA: Diagnosis not present

## 2021-03-30 DIAGNOSIS — R7302 Impaired glucose tolerance (oral): Secondary | ICD-10-CM | POA: Diagnosis not present

## 2021-03-31 ENCOUNTER — Other Ambulatory Visit: Payer: Self-pay

## 2021-03-31 ENCOUNTER — Other Ambulatory Visit (HOSPITAL_COMMUNITY): Payer: Self-pay | Admitting: Pulmonary Disease

## 2021-03-31 ENCOUNTER — Ambulatory Visit (HOSPITAL_COMMUNITY)
Admission: RE | Admit: 2021-03-31 | Discharge: 2021-03-31 | Disposition: A | Discharge status: Home / Self Care | Payer: BC Managed Care – PPO | Source: Ambulatory Visit | Attending: Pulmonary Disease | Admitting: Pulmonary Disease

## 2021-03-31 DIAGNOSIS — M545 Low back pain, unspecified: Secondary | ICD-10-CM | POA: Diagnosis not present

## 2021-03-31 DIAGNOSIS — M25552 Pain in left hip: Secondary | ICD-10-CM | POA: Diagnosis not present

## 2021-03-31 DIAGNOSIS — M5440 Lumbago with sciatica, unspecified side: Secondary | ICD-10-CM

## 2021-05-25 DIAGNOSIS — Z79899 Other long term (current) drug therapy: Secondary | ICD-10-CM | POA: Diagnosis not present

## 2021-05-25 DIAGNOSIS — J441 Chronic obstructive pulmonary disease with (acute) exacerbation: Secondary | ICD-10-CM | POA: Diagnosis not present

## 2021-05-25 DIAGNOSIS — Z720 Tobacco use: Secondary | ICD-10-CM | POA: Diagnosis not present

## 2021-05-25 DIAGNOSIS — K2100 Gastro-esophageal reflux disease with esophagitis, without bleeding: Secondary | ICD-10-CM | POA: Diagnosis not present

## 2021-09-21 DIAGNOSIS — Z125 Encounter for screening for malignant neoplasm of prostate: Secondary | ICD-10-CM | POA: Diagnosis not present

## 2021-09-21 DIAGNOSIS — J441 Chronic obstructive pulmonary disease with (acute) exacerbation: Secondary | ICD-10-CM | POA: Diagnosis not present

## 2021-09-21 DIAGNOSIS — F17200 Nicotine dependence, unspecified, uncomplicated: Secondary | ICD-10-CM | POA: Diagnosis not present

## 2021-09-21 DIAGNOSIS — Z79899 Other long term (current) drug therapy: Secondary | ICD-10-CM | POA: Diagnosis not present

## 2021-09-21 DIAGNOSIS — K2100 Gastro-esophageal reflux disease with esophagitis, without bleeding: Secondary | ICD-10-CM | POA: Diagnosis not present

## 2021-09-21 DIAGNOSIS — E785 Hyperlipidemia, unspecified: Secondary | ICD-10-CM | POA: Diagnosis not present

## 2021-09-21 DIAGNOSIS — R7302 Impaired glucose tolerance (oral): Secondary | ICD-10-CM | POA: Diagnosis not present

## 2022-03-16 DIAGNOSIS — J441 Chronic obstructive pulmonary disease with (acute) exacerbation: Secondary | ICD-10-CM | POA: Diagnosis not present

## 2022-03-16 DIAGNOSIS — F17200 Nicotine dependence, unspecified, uncomplicated: Secondary | ICD-10-CM | POA: Diagnosis not present

## 2022-03-16 DIAGNOSIS — Z79899 Other long term (current) drug therapy: Secondary | ICD-10-CM | POA: Diagnosis not present

## 2022-03-16 DIAGNOSIS — K2100 Gastro-esophageal reflux disease with esophagitis, without bleeding: Secondary | ICD-10-CM | POA: Diagnosis not present

## 2022-07-19 DIAGNOSIS — K2100 Gastro-esophageal reflux disease with esophagitis, without bleeding: Secondary | ICD-10-CM | POA: Diagnosis not present

## 2022-07-19 DIAGNOSIS — J441 Chronic obstructive pulmonary disease with (acute) exacerbation: Secondary | ICD-10-CM | POA: Diagnosis not present

## 2022-07-19 DIAGNOSIS — E785 Hyperlipidemia, unspecified: Secondary | ICD-10-CM | POA: Diagnosis not present

## 2022-07-19 DIAGNOSIS — Z125 Encounter for screening for malignant neoplasm of prostate: Secondary | ICD-10-CM | POA: Diagnosis not present

## 2022-07-19 DIAGNOSIS — Z79899 Other long term (current) drug therapy: Secondary | ICD-10-CM | POA: Diagnosis not present

## 2022-07-19 DIAGNOSIS — R7302 Impaired glucose tolerance (oral): Secondary | ICD-10-CM | POA: Diagnosis not present

## 2022-07-19 DIAGNOSIS — F17200 Nicotine dependence, unspecified, uncomplicated: Secondary | ICD-10-CM | POA: Diagnosis not present

## 2022-07-23 DIAGNOSIS — J069 Acute upper respiratory infection, unspecified: Secondary | ICD-10-CM | POA: Diagnosis not present

## 2022-07-23 DIAGNOSIS — R051 Acute cough: Secondary | ICD-10-CM | POA: Diagnosis not present

## 2022-07-23 DIAGNOSIS — Z03818 Encounter for observation for suspected exposure to other biological agents ruled out: Secondary | ICD-10-CM | POA: Diagnosis not present

## 2022-07-23 DIAGNOSIS — Z20822 Contact with and (suspected) exposure to covid-19: Secondary | ICD-10-CM | POA: Diagnosis not present

## 2022-09-07 DIAGNOSIS — Z86010 Personal history of colonic polyps: Secondary | ICD-10-CM | POA: Diagnosis not present

## 2022-09-07 DIAGNOSIS — Z09 Encounter for follow-up examination after completed treatment for conditions other than malignant neoplasm: Secondary | ICD-10-CM | POA: Diagnosis not present

## 2022-09-07 DIAGNOSIS — K635 Polyp of colon: Secondary | ICD-10-CM | POA: Diagnosis not present

## 2022-12-01 DIAGNOSIS — R030 Elevated blood-pressure reading, without diagnosis of hypertension: Secondary | ICD-10-CM | POA: Diagnosis not present

## 2022-12-01 DIAGNOSIS — J029 Acute pharyngitis, unspecified: Secondary | ICD-10-CM | POA: Diagnosis not present

## 2022-12-01 DIAGNOSIS — M791 Myalgia, unspecified site: Secondary | ICD-10-CM | POA: Diagnosis not present

## 2022-12-01 DIAGNOSIS — Z03818 Encounter for observation for suspected exposure to other biological agents ruled out: Secondary | ICD-10-CM | POA: Diagnosis not present

## 2022-12-01 DIAGNOSIS — J309 Allergic rhinitis, unspecified: Secondary | ICD-10-CM | POA: Diagnosis not present

## 2022-12-20 ENCOUNTER — Other Ambulatory Visit: Payer: Self-pay

## 2022-12-20 DIAGNOSIS — R1031 Right lower quadrant pain: Secondary | ICD-10-CM

## 2022-12-20 DIAGNOSIS — R8281 Pyuria: Secondary | ICD-10-CM | POA: Diagnosis not present

## 2022-12-20 DIAGNOSIS — R3129 Other microscopic hematuria: Secondary | ICD-10-CM

## 2022-12-20 DIAGNOSIS — K2100 Gastro-esophageal reflux disease with esophagitis, without bleeding: Secondary | ICD-10-CM | POA: Diagnosis not present

## 2022-12-20 DIAGNOSIS — J441 Chronic obstructive pulmonary disease with (acute) exacerbation: Secondary | ICD-10-CM | POA: Diagnosis not present

## 2022-12-20 DIAGNOSIS — Z79899 Other long term (current) drug therapy: Secondary | ICD-10-CM | POA: Diagnosis not present

## 2022-12-26 ENCOUNTER — Ambulatory Visit (HOSPITAL_BASED_OUTPATIENT_CLINIC_OR_DEPARTMENT_OTHER)
Admission: RE | Admit: 2022-12-26 | Discharge: 2022-12-26 | Disposition: A | Discharge status: Home / Self Care | Payer: BC Managed Care – PPO | Source: Ambulatory Visit | Attending: Pulmonary Disease | Admitting: Pulmonary Disease

## 2022-12-26 DIAGNOSIS — R3129 Other microscopic hematuria: Secondary | ICD-10-CM | POA: Diagnosis not present

## 2022-12-26 DIAGNOSIS — R1031 Right lower quadrant pain: Secondary | ICD-10-CM | POA: Diagnosis not present

## 2022-12-26 DIAGNOSIS — R109 Unspecified abdominal pain: Secondary | ICD-10-CM | POA: Diagnosis not present

## 2022-12-26 DIAGNOSIS — R319 Hematuria, unspecified: Secondary | ICD-10-CM | POA: Diagnosis not present

## 2023-02-07 DIAGNOSIS — J441 Chronic obstructive pulmonary disease with (acute) exacerbation: Secondary | ICD-10-CM | POA: Diagnosis not present

## 2023-02-07 DIAGNOSIS — Z79899 Other long term (current) drug therapy: Secondary | ICD-10-CM | POA: Diagnosis not present

## 2023-02-07 DIAGNOSIS — R319 Hematuria, unspecified: Secondary | ICD-10-CM | POA: Diagnosis not present

## 2023-02-07 DIAGNOSIS — K2100 Gastro-esophageal reflux disease with esophagitis, without bleeding: Secondary | ICD-10-CM | POA: Diagnosis not present

## 2023-05-16 DIAGNOSIS — J441 Chronic obstructive pulmonary disease with (acute) exacerbation: Secondary | ICD-10-CM | POA: Diagnosis not present

## 2023-05-16 DIAGNOSIS — R7302 Impaired glucose tolerance (oral): Secondary | ICD-10-CM | POA: Diagnosis not present

## 2023-05-16 DIAGNOSIS — M1990 Unspecified osteoarthritis, unspecified site: Secondary | ICD-10-CM | POA: Diagnosis not present

## 2023-05-16 DIAGNOSIS — K2100 Gastro-esophageal reflux disease with esophagitis, without bleeding: Secondary | ICD-10-CM | POA: Diagnosis not present

## 2023-05-16 DIAGNOSIS — E785 Hyperlipidemia, unspecified: Secondary | ICD-10-CM | POA: Diagnosis not present

## 2023-05-16 DIAGNOSIS — Z79899 Other long term (current) drug therapy: Secondary | ICD-10-CM | POA: Diagnosis not present

## 2023-08-29 DIAGNOSIS — J441 Chronic obstructive pulmonary disease with (acute) exacerbation: Secondary | ICD-10-CM | POA: Diagnosis not present

## 2023-08-29 DIAGNOSIS — R0602 Shortness of breath: Secondary | ICD-10-CM | POA: Diagnosis not present

## 2023-08-29 DIAGNOSIS — J0110 Acute frontal sinusitis, unspecified: Secondary | ICD-10-CM | POA: Diagnosis not present

## 2023-08-29 DIAGNOSIS — R051 Acute cough: Secondary | ICD-10-CM | POA: Diagnosis not present

## 2023-09-19 DIAGNOSIS — J441 Chronic obstructive pulmonary disease with (acute) exacerbation: Secondary | ICD-10-CM | POA: Diagnosis not present

## 2023-09-19 DIAGNOSIS — K2100 Gastro-esophageal reflux disease with esophagitis, without bleeding: Secondary | ICD-10-CM | POA: Diagnosis not present

## 2023-09-19 DIAGNOSIS — R7302 Impaired glucose tolerance (oral): Secondary | ICD-10-CM | POA: Diagnosis not present

## 2023-09-19 DIAGNOSIS — M1990 Unspecified osteoarthritis, unspecified site: Secondary | ICD-10-CM | POA: Diagnosis not present

## 2023-09-19 DIAGNOSIS — E785 Hyperlipidemia, unspecified: Secondary | ICD-10-CM | POA: Diagnosis not present

## 2023-09-19 DIAGNOSIS — Z125 Encounter for screening for malignant neoplasm of prostate: Secondary | ICD-10-CM | POA: Diagnosis not present

## 2023-09-19 DIAGNOSIS — Z79899 Other long term (current) drug therapy: Secondary | ICD-10-CM | POA: Diagnosis not present

## 2024-01-23 DIAGNOSIS — M1990 Unspecified osteoarthritis, unspecified site: Secondary | ICD-10-CM | POA: Diagnosis not present

## 2024-01-23 DIAGNOSIS — K2100 Gastro-esophageal reflux disease with esophagitis, without bleeding: Secondary | ICD-10-CM | POA: Diagnosis not present

## 2024-01-23 DIAGNOSIS — Z79899 Other long term (current) drug therapy: Secondary | ICD-10-CM | POA: Diagnosis not present

## 2024-01-23 DIAGNOSIS — J441 Chronic obstructive pulmonary disease with (acute) exacerbation: Secondary | ICD-10-CM | POA: Diagnosis not present

## 2024-01-23 DIAGNOSIS — R8281 Pyuria: Secondary | ICD-10-CM | POA: Diagnosis not present

## 2024-06-05 DIAGNOSIS — E785 Hyperlipidemia, unspecified: Secondary | ICD-10-CM | POA: Diagnosis not present

## 2024-06-05 DIAGNOSIS — R7302 Impaired glucose tolerance (oral): Secondary | ICD-10-CM | POA: Diagnosis not present

## 2024-06-05 DIAGNOSIS — J441 Chronic obstructive pulmonary disease with (acute) exacerbation: Secondary | ICD-10-CM | POA: Diagnosis not present

## 2024-06-05 DIAGNOSIS — Z79899 Other long term (current) drug therapy: Secondary | ICD-10-CM | POA: Diagnosis not present

## 2024-06-05 DIAGNOSIS — K2100 Gastro-esophageal reflux disease with esophagitis, without bleeding: Secondary | ICD-10-CM | POA: Diagnosis not present

## 2024-06-05 DIAGNOSIS — M1990 Unspecified osteoarthritis, unspecified site: Secondary | ICD-10-CM | POA: Diagnosis not present

## 2024-06-11 DIAGNOSIS — J441 Chronic obstructive pulmonary disease with (acute) exacerbation: Secondary | ICD-10-CM | POA: Diagnosis not present

## 2024-06-11 DIAGNOSIS — R030 Elevated blood-pressure reading, without diagnosis of hypertension: Secondary | ICD-10-CM | POA: Diagnosis not present

## 2024-06-11 DIAGNOSIS — J0140 Acute pansinusitis, unspecified: Secondary | ICD-10-CM | POA: Diagnosis not present

## 2024-06-11 DIAGNOSIS — Z6827 Body mass index (BMI) 27.0-27.9, adult: Secondary | ICD-10-CM | POA: Diagnosis not present
